# Patient Record
Sex: Male | Born: 1970 | Race: Black or African American | Hispanic: No | Marital: Single | Smoking: Current every day smoker
Health system: Southern US, Community
[De-identification: ages and names within clinical notes are randomized; demographics above are authoritative.]

## PROBLEM LIST (undated history)

## (undated) DIAGNOSIS — K219 Gastro-esophageal reflux disease without esophagitis: Secondary | ICD-10-CM

---

## 2012-03-14 HISTORY — PX: FRACTURE SURGERY: SHX138

## 2014-02-01 ENCOUNTER — Emergency Department (HOSPITAL_COMMUNITY)
Admission: EM | Admit: 2014-02-01 | Discharge: 2014-02-01 | Disposition: A | Discharge status: Home / Self Care | Payer: Self-pay | Attending: Emergency Medicine | Admitting: Emergency Medicine

## 2014-02-01 ENCOUNTER — Emergency Department (HOSPITAL_COMMUNITY): Payer: Self-pay

## 2014-02-01 ENCOUNTER — Encounter (HOSPITAL_COMMUNITY): Payer: Self-pay | Admitting: Emergency Medicine

## 2014-02-01 DIAGNOSIS — Y998 Other external cause status: Secondary | ICD-10-CM | POA: Insufficient documentation

## 2014-02-01 DIAGNOSIS — S82402A Unspecified fracture of shaft of left fibula, initial encounter for closed fracture: Secondary | ICD-10-CM

## 2014-02-01 DIAGNOSIS — Z720 Tobacco use: Secondary | ICD-10-CM | POA: Insufficient documentation

## 2014-02-01 DIAGNOSIS — S82432A Displaced oblique fracture of shaft of left fibula, initial encounter for closed fracture: Secondary | ICD-10-CM | POA: Insufficient documentation

## 2014-02-01 DIAGNOSIS — Y9289 Other specified places as the place of occurrence of the external cause: Secondary | ICD-10-CM | POA: Insufficient documentation

## 2014-02-01 DIAGNOSIS — X58XXXA Exposure to other specified factors, initial encounter: Secondary | ICD-10-CM | POA: Insufficient documentation

## 2014-02-01 DIAGNOSIS — Y9389 Activity, other specified: Secondary | ICD-10-CM | POA: Insufficient documentation

## 2014-02-01 DIAGNOSIS — W1843XA Slipping, tripping and stumbling without falling due to stepping from one level to another, initial encounter: Secondary | ICD-10-CM

## 2014-02-01 MED ORDER — TRAMADOL HCL 50 MG PO TABS
50.0000 mg | ORAL_TABLET | Freq: Once | ORAL | Status: AC
  Administered 2014-02-01: 50 mg via ORAL
  Filled 2014-02-01: qty 1

## 2014-02-01 MED ORDER — HYDROCODONE-ACETAMINOPHEN 5-325 MG PO TABS
1.0000 | ORAL_TABLET | ORAL | Status: DC | PRN
Start: 2014-02-01 — End: 2018-03-08

## 2014-02-01 MED ORDER — IBUPROFEN 600 MG PO TABS: 600.0000 mg | ORAL_TABLET | Freq: Four times a day (QID) | ORAL | Status: DC | PRN

## 2014-02-01 NOTE — ED Provider Notes (Signed)
CSN: [id number]     Arrival date & time 02/01/14  1624 History  This chart was scribed for non-physician practitioner working with No att. providers found by Elveria Risingimelie Horne, ED Scribe. This patient was seen in room TR10C/TR10C and the patient's care was started at 5:11 PM.    Chief Complaint  Patient presents with  . Ankle Pain    The history is provided by the patient. No language interpreter was used.   HPI Comments: Brandon Grant is a 43 y.o. male who presents to the Emergency Department with a left ankle pain onset today at 11am. Patient reports stepping down from height of an elevated van onto the curb and only making contact with his toes; forceful dorsiflexion. Patient reports a compound facture one year ago and having pins placed in his foot which was aggravated by his incident today.  Pain is constant, aching, throbbing, 8/10 at worst. No pain medication taken PTA.  History reviewed. No pertinent past medical history. History reviewed. No pertinent past surgical history. History reviewed. No pertinent family history. History  Substance Use Topics  . Smoking status: Current Every Day Smoker  . Smokeless tobacco: Not on file  . Alcohol Use: No    Review of Systems  Constitutional: Negative for fever and chills.  Musculoskeletal: Positive for arthralgias.  Neurological: Negative for weakness and numbness.      Allergies  Review of patient's allergies indicates no known allergies.  Home Medications   Prior to Admission medications   Medication Sig Start Date End Date Taking? Authorizing Provider  HYDROcodone-acetaminophen (NORCO/VICODIN) 5-325 MG per tablet Take 1-2 tablets by mouth every 4 (four) hours as needed for moderate pain or severe pain. 02/01/14   Junius FinnerErin O'Malley, PA-C  ibuprofen (ADVIL,MOTRIN) 600 MG tablet Take 1 tablet (600 mg total) by mouth every 6 (six) hours as needed. 02/01/14   Junius FinnerErin O'Malley, PA-C   Triage Vitals: BP 108/66 mmHg  Pulse 82  Temp(Src)  98.4 F (36.9 C)  Resp 16  Ht 6' (1.829 m)  Wt 204 lb (92.534 kg)  BMI 27.66 kg/m2  SpO2 98% Physical Exam  Constitutional: He is oriented to person, place, and time. He appears well-developed and well-nourished. No distress.  HENT:  Head: Normocephalic and atraumatic.  Eyes: EOM are normal.  Neck: Neck supple. No tracheal deviation present.  Cardiovascular: Normal rate.   Pulmonary/Chest: Effort normal. No respiratory distress.  Musculoskeletal: Normal range of motion.  Left ankle with well healed surgical scars. No edema. Tenderness to medial aspect of left ankle. No calf tenderness. Full ROM. Pedal pulses 2+.   Neurological: He is alert and oriented to person, place, and time.  Gross sensation intact.  Skin: Skin is warm and dry.  Psychiatric: He has a normal mood and affect. His behavior is normal.  Nursing note and vitals reviewed.   ED Course  Procedures (including critical care time)  COORDINATION OF CARE: 11:43 PM- Discussed treatment plan with patient at bedside and patient agreed to plan.   Labs Review Labs Reviewed - No data to display  Imaging Review Dg Ankle Complete Left  02/01/2014   CLINICAL DATA:  Injury scratch head and ankle 1 year ago. Re-injury today getting out of vehicle when foot slipped.  EXAM: LEFT ANKLE COMPLETE - 3+ VIEW  COMPARISON:  None.  FINDINGS: There is an oblique fracture through the distal left fibula, minimally displaced. Well corticated bone fragments along the medial malleolus likely reflect old injury. No acute tibial abnormality seen.  IMPRESSION: Minimally displaced oblique distal left fibular metaphyseal fracture.   Electronically Signed   By: Charlett NoseKevin  Dover M.D.   On: 02/01/2014 18:41     EKG Interpretation None      MDM   Final diagnoses:  Left fibular fracture, closed, initial encounter  Slip/trip w/o fall d/t step from one level to another, init    Pt presenting to ED with c/o left ankle pain after trip on a curb.  Left  foot neurovascularly in tact. Skin in tact. Sensation in tact. Plain films: minimally displaced oblique distal left fibular metaphyseal fracture. Placed pt in posterior short leg splint, crutches and work note provided. Rx: ibuprofen and norco. Advised to f/u with Dr. Eulah PontMurphy, orthopedics, for recheck and continued management of left fibula fracture. Pt verbalized understanding and agreement with tx plan.  I personally performed the services described in this documentation, which was scribed in my presence. The recorded information has been reviewed and is accurate.    Junius Finnerrin O'Malley, PA-C 02/01/14 2346  Elwin MochaBlair Walden, MD 02/01/14 [phone]

## 2014-02-01 NOTE — ED Notes (Signed)
Pt sts left ankle pain after stepping off curb today; pt sts hx of injury to same leg

## 2014-02-01 NOTE — ED Notes (Signed)
Ortho tech called for treatment.

## 2014-02-01 NOTE — Progress Notes (Signed)
Orthopedic Tech Progress Note Patient Details:  Brandon MapleMichael Muratalla 01-18-1971 [id number] Applied fiberglass short leg splint to LLE.  Pulses, sensation, motion intact before and after application. Capillary refill less than 2 seconds before and after application.  Fit crutches and taught pt. use of same. Ortho Devices Type of Ortho Device: Post (short leg) splint, Crutches Ortho Device/Splint Location: LLE Ortho Device/Splint Interventions: Application   Lesle ChrisGilliland, Keyston Ardolino L 02/01/2014, 8:16 PM

## 2014-02-01 NOTE — Discharge Instructions (Signed)
Cast or Splint Care °Casts and splints support injured limbs and keep bones from moving while they heal.  °HOME CARE °· Keep the cast or splint uncovered during the drying period. °¨ A plaster cast can take 24 to 48 hours to dry. °¨ A fiberglass cast will dry in less than 1 hour. °· Do not rest the cast on anything harder than a pillow for 24 hours. °· Do not put weight on your injured limb. Do not put pressure on the cast. Wait for your doctor's approval. °· Keep the cast or splint dry. °¨ Cover the cast or splint with a plastic bag during baths or wet weather. °¨ If you have a cast over your chest and belly (trunk), take sponge baths until the cast is taken off. °¨ If your cast gets wet, dry it with a towel or blow dryer. Use the cool setting on the blow dryer. °· Keep your cast or splint clean. Wash a dirty cast with a damp cloth. °· Do not put any objects under your cast or splint. °· Do not scratch the skin under the cast with an object. If itching is a problem, use a blow dryer on a cool setting over the itchy area. °· Do not trim or cut your cast. °· Do not take out the padding from inside your cast. °· Exercise your joints near the cast as told by your doctor. °· Raise (elevate) your injured limb on 1 or 2 pillows for the first 1 to 3 days. °GET HELP IF: °· Your cast or splint cracks. °· Your cast or splint is too tight or too loose. °· You itch badly under the cast. °· Your cast gets wet or has a soft spot. °· You have a bad smell coming from the cast. °· You get an object stuck under the cast. °· Your skin around the cast becomes red or sore. °· You have new or more pain after the cast is put on. °GET HELP RIGHT AWAY IF: °· You have fluid leaking through the cast. °· You cannot move your fingers or toes. °· Your fingers or toes turn blue or white or are cool, painful, or puffy (swollen). °· You have tingling or lose feeling (numbness) around the injured area. °· You have bad pain or pressure under the  cast. °· You have trouble breathing or have shortness of breath. °· You have chest pain. °Document Released: 06/30/2010 Document Revised: 10/31/2012 Document Reviewed: 09/06/2012 °Exit[employer]® Patient Information ©2015 [employer]. This information is not intended to replace advice given to you by your health care provider. Make sure you discuss any questions you have with your health care provider. ° °

## 2015-12-16 IMAGING — CR DG ANKLE COMPLETE 3+V*L*
3 series · 3 of 3 positions shown · non-contrast
Comparison: None.

CLINICAL DATA: Injury scratch head and ankle 1 year ago. Re-injury
today getting out of vehicle when foot slipped.

EXAM:
LEFT ANKLE COMPLETE - 3+ VIEW

[x ankle ap left]
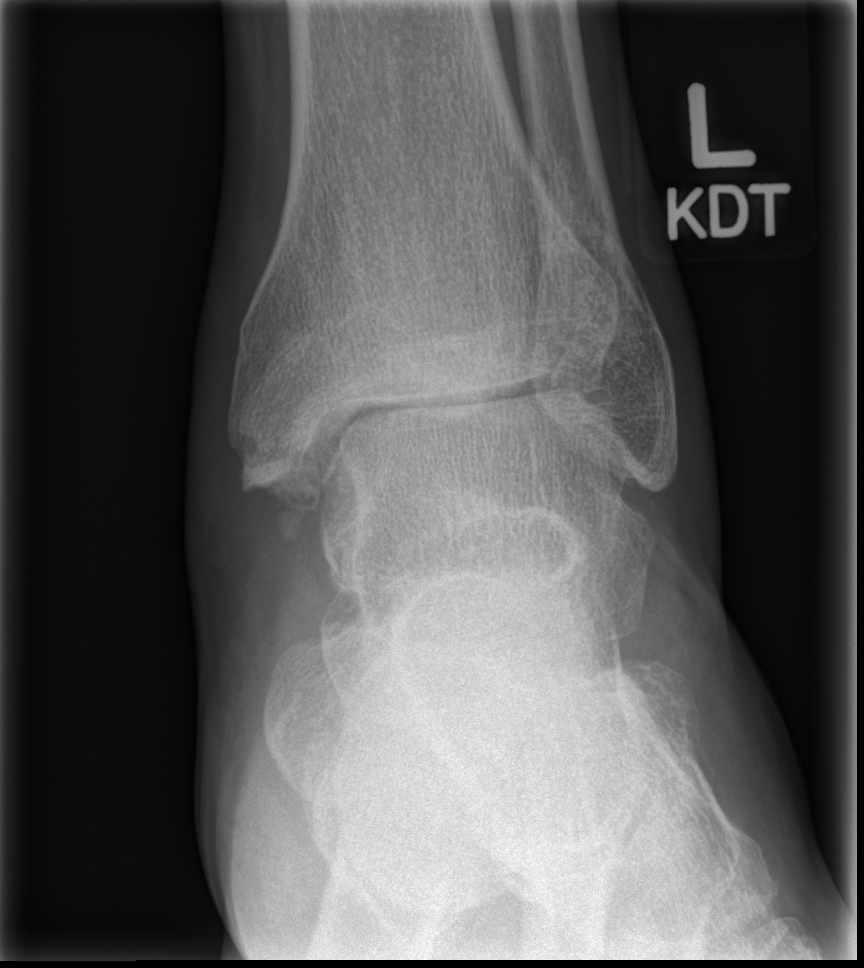

[x ankle obl left]
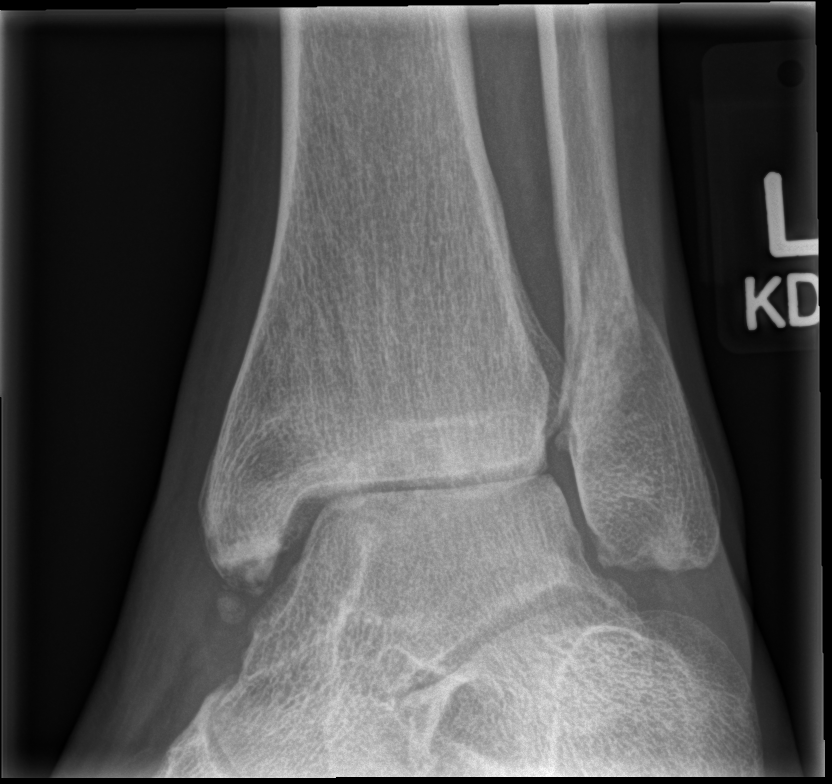

[x ankle lat left]
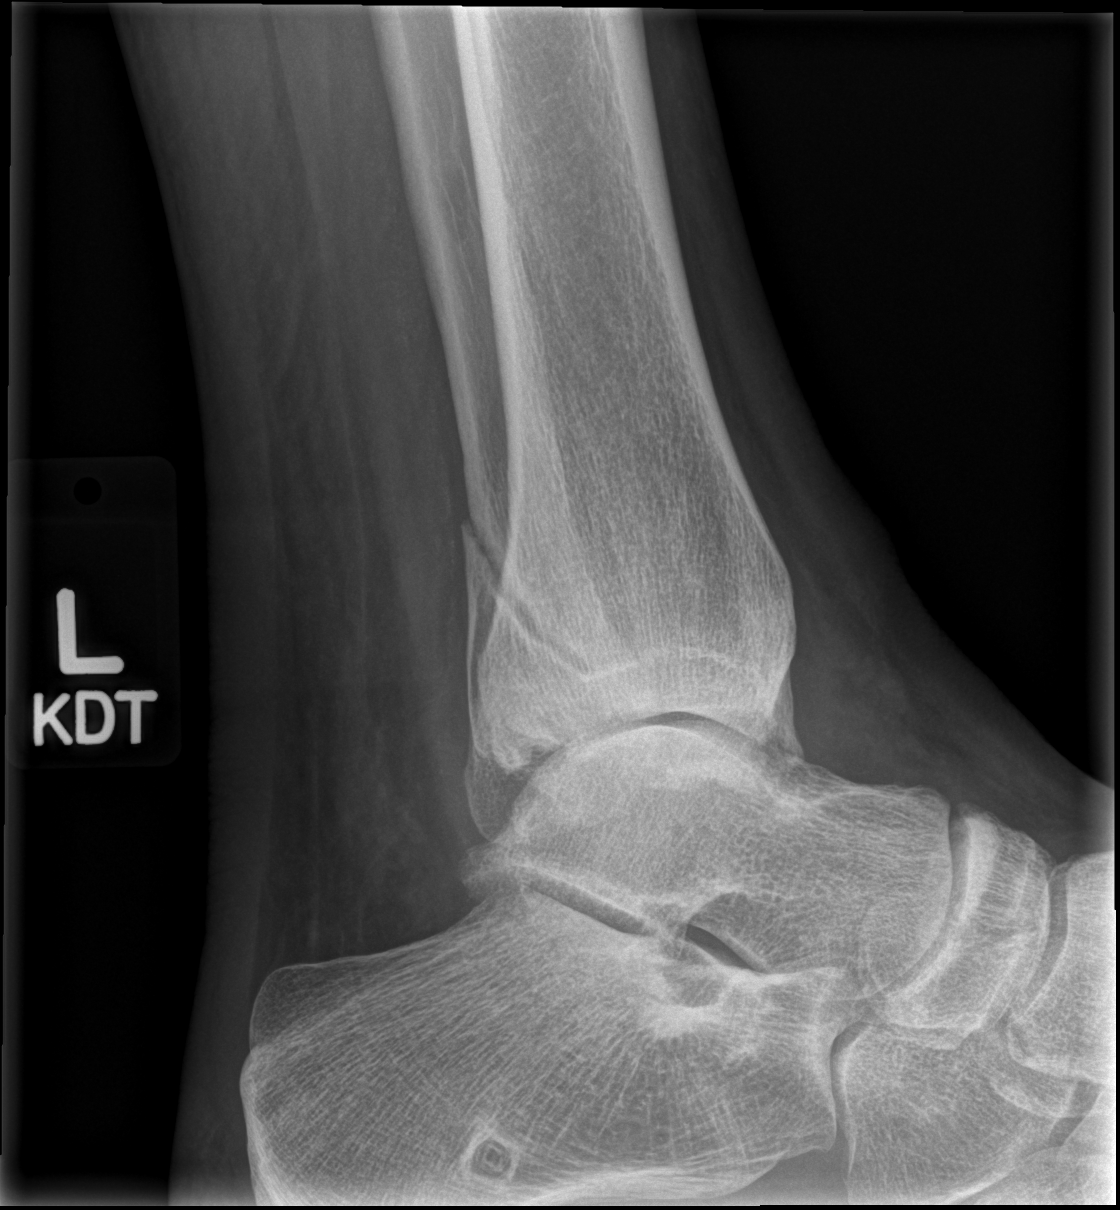

[3 of 3 positions shown; findings below may reference images not displayed]

FINDINGS: There is an oblique fracture through the distal left fibula,
minimally displaced. Well corticated bone fragments along the medial
malleolus likely reflect old injury. No acute tibial abnormality
seen.
IMPRESSION: Minimally displaced oblique distal left fibular metaphyseal
fracture.

## 2018-03-08 ENCOUNTER — Encounter (HOSPITAL_COMMUNITY): Payer: Self-pay | Admitting: Emergency Medicine

## 2018-03-08 ENCOUNTER — Emergency Department (HOSPITAL_COMMUNITY)
Admission: EM | Admit: 2018-03-08 | Discharge: 2018-03-08 | Disposition: A | Discharge status: Home / Self Care | Payer: Self-pay | Attending: Emergency Medicine | Admitting: Emergency Medicine

## 2018-03-08 ENCOUNTER — Other Ambulatory Visit: Payer: Self-pay

## 2018-03-08 ENCOUNTER — Emergency Department (HOSPITAL_COMMUNITY): Payer: Self-pay

## 2018-03-08 DIAGNOSIS — F17200 Nicotine dependence, unspecified, uncomplicated: Secondary | ICD-10-CM | POA: Insufficient documentation

## 2018-03-08 DIAGNOSIS — M19172 Post-traumatic osteoarthritis, left ankle and foot: Secondary | ICD-10-CM | POA: Insufficient documentation

## 2018-03-08 DIAGNOSIS — M25572 Pain in left ankle and joints of left foot: Secondary | ICD-10-CM | POA: Insufficient documentation

## 2018-03-08 MED ORDER — IBUPROFEN 600 MG PO TABS: 600.0000 mg | ORAL_TABLET | Freq: Four times a day (QID) | ORAL | 0 refills | Status: DC | PRN

## 2018-03-08 MED ORDER — ACETAMINOPHEN 500 MG PO TABS: 500.0000 mg | ORAL_TABLET | Freq: Four times a day (QID) | ORAL | 0 refills | Status: DC | PRN

## 2018-03-08 NOTE — ED Notes (Signed)
ED Provider at bedside. 

## 2018-03-08 NOTE — Discharge Instructions (Signed)
Alternate ibuprofen and Tylenol as prescribed for your pain.  Use ice 3-4 times daily alternating 20 minutes on, 20 minutes off, as needed for swelling.  Wear your brace especially when walking.  Please follow-up with the orthopedic doctor for further evaluation and treatment of your ongoing pain.

## 2018-03-08 NOTE — ED Triage Notes (Signed)
Per pt: here with c/o left foot and ankle pain from a previous accident and surgery.  Pt states pain began this morning 7/10.

## 2018-03-08 NOTE — ED Provider Notes (Addendum)
[facility] Provider Note   CSN: [id number] Arrival date & time: 03/08/18  1126     History   Chief Complaint Chief Complaint  Patient presents with  . Foot Pain  . Ankle Pain    HPI Brandon Grant is a 47 y.o. male with history of ankle surgery from previous trauma who presents with left ankle pain that began this morning.  He rated his pain as 7 out of 10.  He has had some tingling to the pain.  He reports he is in a rehab facility here [place]  He feels like maybe the cold is causing him to have arthritis pain.  He denies any new injury.  He has been taking ibuprofen without relief.  Patient denies any fevers or history of IV drug use.  He denies any chest pain, shortness of breath, abdominal pain, nausea, vomiting.  HPI  History reviewed. No pertinent past medical history.  There are no active problems to display for this patient.   History reviewed. No pertinent surgical history.      Home Medications    Prior to Admission medications   Medication Sig Start Date End Date Taking? Authorizing Provider  acetaminophen (TYLENOL) 500 MG tablet Take 1 tablet (500 mg total) by mouth every 6 (six) hours as needed. 03/08/18   Brantley Wiley, Waylan BogaAlexandra M, PA-C  ibuprofen (ADVIL,MOTRIN) 600 MG tablet Take 1 tablet (600 mg total) by mouth every 6 (six) hours as needed. 03/08/18   Emi HolesLaw, Elisandra Deshmukh M, PA-C    Family History History reviewed. No pertinent family history.  Social History Social History   Tobacco Use  . Smoking status: Current Every Day Smoker  Substance Use Topics  . Alcohol use: No  . Drug use: No     Allergies   Patient has no known allergies.   Review of Systems Review of Systems  Constitutional: Negative for chills and fever.  HENT: Negative for facial swelling and sore throat.   Respiratory: Negative for shortness of breath.   Cardiovascular: Negative for chest pain.  Gastrointestinal:  Negative for abdominal pain, nausea and vomiting.  Genitourinary: Negative for dysuria.  Musculoskeletal: Positive for arthralgias. Negative for back pain and joint swelling.  Skin: Negative for rash and wound.  Neurological: Positive for numbness (paresthesia). Negative for headaches.  Psychiatric/Behavioral: The patient is not nervous/anxious.      Physical Exam Updated Vital Signs BP 118/69 (BP Location: Right Arm)   Pulse 74   Temp 98.2 F (36.8 C) (Oral)   Resp 16   Ht 6' (1.829 m)   Wt 95.3 kg   SpO2 100%   BMI 28.48 kg/m   Physical Exam Vitals signs and nursing note reviewed.  Constitutional:      General: He is not in acute distress.    Appearance: He is well-developed. He is not diaphoretic.  HENT:     Head: Normocephalic and atraumatic.     Mouth/Throat:     Pharynx: No oropharyngeal exudate.  Eyes:     General: No scleral icterus.       Right eye: No discharge.        Left eye: No discharge.     Conjunctiva/sclera: Conjunctivae normal.     Pupils: Pupils are equal, round, and reactive to light.  Neck:     Musculoskeletal: Normal range of motion and neck supple.     Thyroid: No thyromegaly.  Cardiovascular:     Rate and Rhythm:  Normal rate and regular rhythm.     Heart sounds: Normal heart sounds. No murmur. No friction rub. No gallop.   Pulmonary:     Effort: Pulmonary effort is normal. No respiratory distress.     Breath sounds: Normal breath sounds. No stridor. No wheezing or rales.  Abdominal:     General: Bowel sounds are normal. There is no distension.     Palpations: Abdomen is soft.     Tenderness: There is no abdominal tenderness. There is no guarding or rebound.  Musculoskeletal:     Comments: Postsurgical scarring noted to the left medial ankle with tenderness in the area, no tenderness to the foot, knee, proximal fibula; range of motion is limited, however this is baseline; sensation intact, DP pulses intact; there is no warmth or erythema    Lymphadenopathy:     Cervical: No cervical adenopathy.  Skin:    General: Skin is warm and dry.     Coloration: Skin is not pale.     Findings: No rash.  Neurological:     Mental Status: He is alert.     Coordination: Coordination normal.      ED Treatments / Results  Labs (all labs ordered are listed, but only abnormal results are displayed) Labs Reviewed - No data to display  EKG None  Radiology Dg Ankle Complete Left  Result Date: 03/08/2018 CLINICAL DATA:  Medial left ankle pain since a twisting injury last night. Initial encounter. EXAM: LEFT ANKLE COMPLETE - 3+ VIEW COMPARISON:  Plain films left ankle 02/01/2014. FINDINGS: The patient has remote healed fractures of the medial and lateral malleoli. No acute fracture is identified. There is advanced tibiotalar degenerative change with bone-on-bone joint space narrowing, subchondral sclerosis and some subchondral cyst formation. Mild osteophytosis is seen about the joint. No joint effusion. IMPRESSION: No acute abnormality. Remote healed medial and lateral malleolar fractures. Advanced tibiotalar osteoarthritis. Electronically Signed   By: Drusilla Kannerhomas  Dalessio M.D.   On: 03/08/2018 12:33    Procedures Procedures (including critical care time)  Medications Ordered in ED Medications - No data to display   Initial Impression / Assessment and Plan / ED Course  I have reviewed the triage vital signs and the nursing notes.  Pertinent labs & imaging results that were available during my care of the patient were reviewed by me and considered in my medical decision making (see chart for details).     Patient with left ankle pain.  Patient has history of fracture and surgery 4 years ago.  X-ray today shows no acute abnormality, but remote healed medial and lateral malleoli fractures as well as advanced tibiotalar osteoarthritis.  There are no signs of septic joint.  Patient will be treated supportively with NSAIDs, Tylenol, ASO,  crutches.  Patient given referral to orthopedics.  Patient is ambulatory.  Return precautions discussed.  Patient understands and agrees with plan.  Patient vital stable throughout ED course and discharged in satisfactory condition.  Final Clinical Impressions(s) / ED Diagnoses   Final diagnoses:  Acute left ankle pain  Post-traumatic osteoarthritis of left ankle    ED Discharge Orders         Ordered    ibuprofen (ADVIL,MOTRIN) 600 MG tablet  Every 6 hours PRN     03/08/18 1336    acetaminophen (TYLENOL) 500 MG tablet  Every 6 hours PRN     03/08/18 1336              Keston Seever, [place] M, [place]PA-C 03/08/18  [address] DO 03/08/18 1526

## 2020-01-20 IMAGING — DX DG ANKLE COMPLETE 3+V*L*
3 series · 3 of 3 positions shown · non-contrast
Comparison: Plain films left ankle 02/01/2014.

CLINICAL DATA: Medial left ankle pain since a twisting injury last
night. Initial encounter.

EXAM:
LEFT ANKLE COMPLETE - 3+ VIEW

[x ankle lat left]
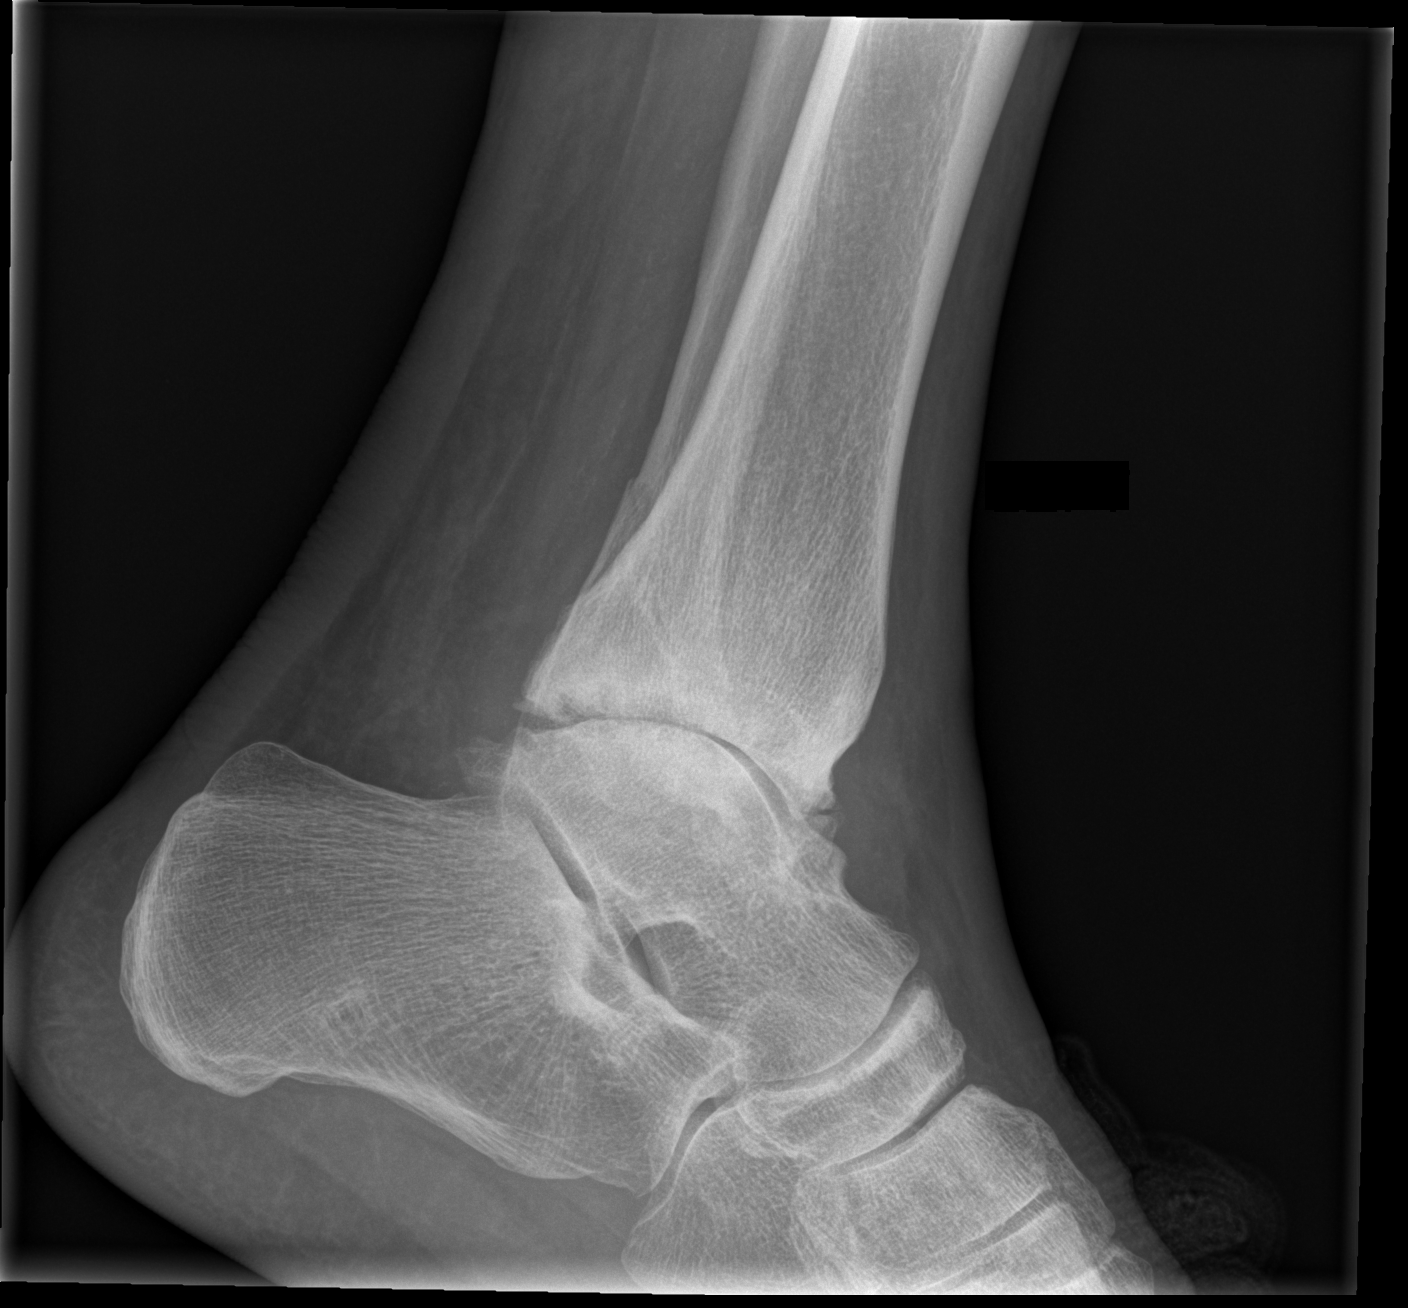

[x ankle ap left]
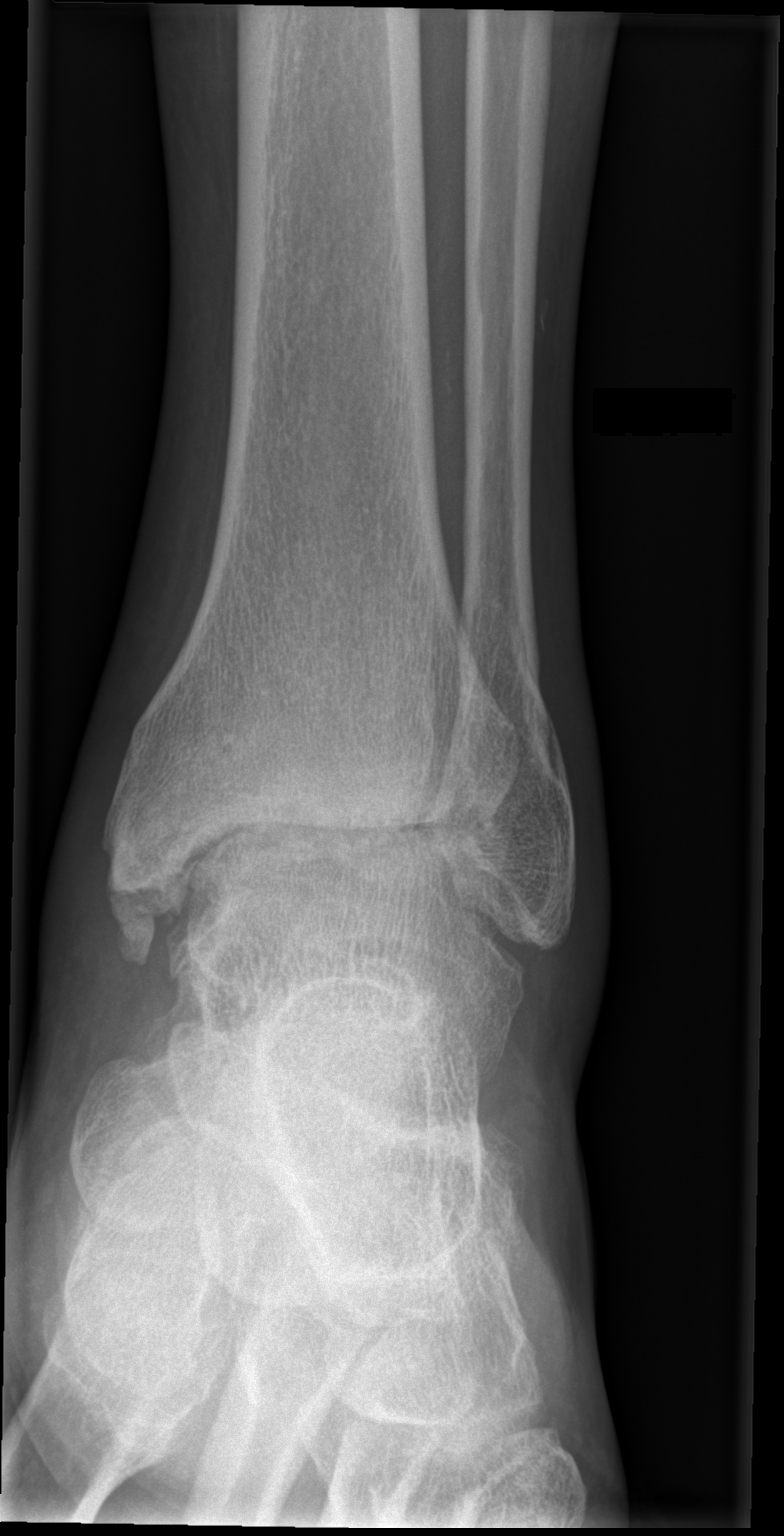

[x ankle obl left]
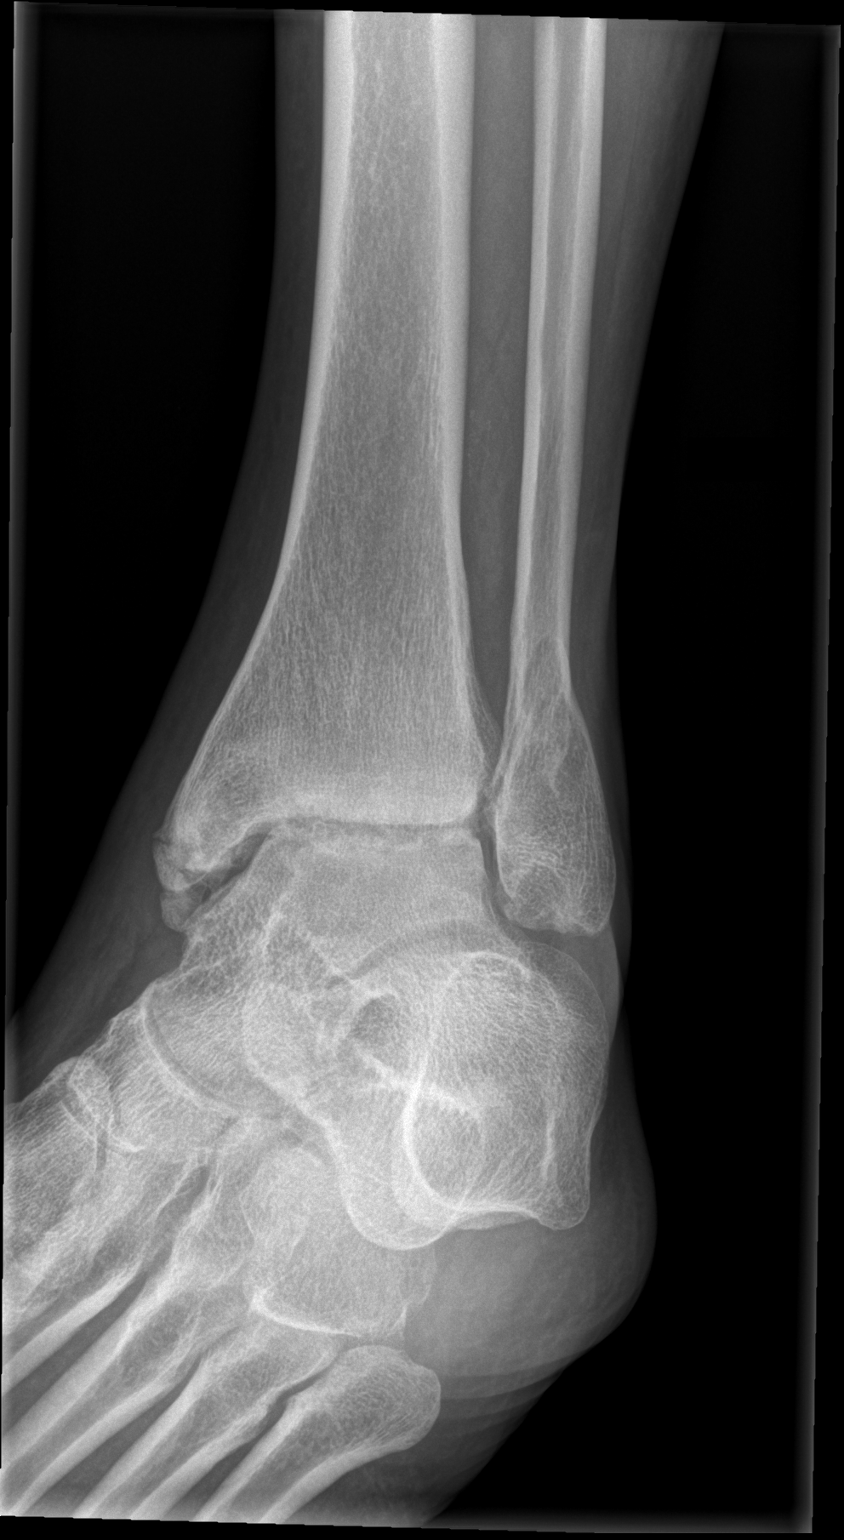

[3 of 3 positions shown; findings below may reference images not displayed]

FINDINGS: The patient has remote healed fractures of the medial and lateral
malleoli. No acute fracture is identified. There is advanced
tibiotalar degenerative change with bone-on-bone joint space
narrowing, subchondral sclerosis and some subchondral cyst
formation. Mild osteophytosis is seen about the joint. No joint
effusion.
IMPRESSION: No acute abnormality.

Remote healed medial and lateral malleolar fractures.

Advanced tibiotalar osteoarthritis.

## 2021-09-07 ENCOUNTER — Encounter (HOSPITAL_COMMUNITY): Payer: Self-pay

## 2021-09-07 ENCOUNTER — Ambulatory Visit (HOSPITAL_COMMUNITY)
Admission: EM | Admit: 2021-09-07 | Discharge: 2021-09-07 | Disposition: A | Discharge status: Home / Self Care | Payer: Self-pay | Attending: Emergency Medicine | Admitting: Emergency Medicine

## 2021-09-07 ENCOUNTER — Ambulatory Visit (INDEPENDENT_AMBULATORY_CARE_PROVIDER_SITE_OTHER): Payer: Self-pay

## 2021-09-07 DIAGNOSIS — M25572 Pain in left ankle and joints of left foot: Secondary | ICD-10-CM

## 2021-09-07 DIAGNOSIS — M545 Low back pain, unspecified: Secondary | ICD-10-CM

## 2021-09-07 DIAGNOSIS — G8929 Other chronic pain: Secondary | ICD-10-CM

## 2021-09-07 LAB — POCT URINALYSIS DIPSTICK, ED / UC
Bilirubin Urine: NEGATIVE
Glucose, UA: NEGATIVE mg/dL
Hgb urine dipstick: NEGATIVE
Ketones, ur: NEGATIVE mg/dL
Leukocytes,Ua: NEGATIVE
Nitrite: NEGATIVE
Protein, ur: NEGATIVE mg/dL
Specific Gravity, Urine: 1.02 (ref 1.005–1.030)
Urobilinogen, UA: 0.2 mg/dL (ref 0.0–1.0)
pH: 8.5 — ABNORMAL HIGH (ref 5.0–8.0)

## 2021-09-07 MED ORDER — IBUPROFEN 800 MG PO TABS
800.0000 mg | ORAL_TABLET | Freq: Once | ORAL | Status: AC
  Administered 2021-09-07: 800 mg via ORAL

## 2021-09-07 MED ORDER — IBUPROFEN 800 MG PO TABS
ORAL_TABLET | ORAL | Status: AC
  Filled 2021-09-07: qty 1

## 2021-09-07 MED ORDER — CYCLOBENZAPRINE HCL 10 MG PO TABS: 10.0000 mg | ORAL_TABLET | Freq: Two times a day (BID) | ORAL | 0 refills | Status: DC | PRN

## 2021-09-07 NOTE — ED Triage Notes (Signed)
Pt states lower back pain and frequent urination for the past week.  Also C/O left ankle pain.  Pt denies any injury, and has not been taking anything at home for the pain.

## 2021-09-30 ENCOUNTER — Ambulatory Visit (INDEPENDENT_AMBULATORY_CARE_PROVIDER_SITE_OTHER): Payer: Self-pay

## 2021-09-30 ENCOUNTER — Encounter (HOSPITAL_COMMUNITY): Payer: Self-pay

## 2021-09-30 ENCOUNTER — Ambulatory Visit (HOSPITAL_COMMUNITY)
Admission: EM | Admit: 2021-09-30 | Discharge: 2021-09-30 | Disposition: A | Discharge status: Home / Self Care | Payer: Self-pay | Attending: Emergency Medicine | Admitting: Emergency Medicine

## 2021-09-30 DIAGNOSIS — M25572 Pain in left ankle and joints of left foot: Secondary | ICD-10-CM

## 2021-09-30 DIAGNOSIS — S99912A Unspecified injury of left ankle, initial encounter: Secondary | ICD-10-CM

## 2021-09-30 DIAGNOSIS — G8929 Other chronic pain: Secondary | ICD-10-CM

## 2021-09-30 DIAGNOSIS — M79672 Pain in left foot: Secondary | ICD-10-CM

## 2021-09-30 MED ORDER — IBUPROFEN 800 MG PO TABS
ORAL_TABLET | ORAL | Status: AC
  Filled 2021-09-30: qty 1

## 2021-09-30 MED ORDER — IBUPROFEN 800 MG PO TABS
800.0000 mg | ORAL_TABLET | Freq: Once | ORAL | Status: AC
  Administered 2021-09-30: 800 mg via ORAL

## 2021-09-30 NOTE — Discharge Instructions (Addendum)
Can take ibuprofen every 6 hours for pain.  I recommend resting, elevating the leg, applying ice to the ankle as needed.  Please follow-up with orthopedics.  They specialize in bone and joint injuries and can further evaluate you. Have the [facility] house call to set up an appointment for you.

## 2021-09-30 NOTE — ED Provider Notes (Signed)
[facility]    CSN: [id number] Arrival date & time: 09/30/21  1739      History   Chief Complaint Chief Complaint  Patient presents with   Foot Injury    HPI Brandon Grant is a 51 y.o. male.  Presents with left ankle pain and swelling. Reports he stepped off a curb today wearing slippers and hurt his ankle.  Does not believe he twisted it. Did not fall. History of foot surgery and hardware in this ankle. Feels like there is "something hard" in the wrong place.  He was not able to walk after this injury occurred.  Has not taken any medicine prior to arrival.  Reports swelling has somewhat gone down but pain remains.  8/10  He does have chronic pain of this ankle since medial and lateral malleolar fractures occurred many years ago.  Additionally osteoarthritis and sclerosis on imaging done by this provider at previous visit 3 weeks ago. No hardware was visualized on that imaging.  History reviewed. No pertinent past medical history.  There are no problems to display for this patient.  History reviewed. No pertinent surgical history.    Home Medications    Prior to Admission medications   Medication Sig Start Date End Date Taking? Authorizing Provider  ibuprofen (ADVIL,MOTRIN) 600 MG tablet Take 1 tablet (600 mg total) by mouth every 6 (six) hours as needed. 03/08/18  Yes Law, Waylan Boga, PA-C  acetaminophen (TYLENOL) 500 MG tablet Take 1 tablet (500 mg total) by mouth every 6 (six) hours as needed. 03/08/18   Law, Waylan Boga, PA-C  cyclobenzaprine (FLEXERIL) 10 MG tablet Take 1 tablet (10 mg total) by mouth 2 (two) times daily as needed for muscle spasms. 09/07/21   Graysin Luczynski, Ray Church    Family History History reviewed. No pertinent family history.  Social History Social History   Tobacco Use   Smoking status: Every Day  Substance Use Topics   Alcohol use: No   Drug use: No     Allergies   Patient has no known allergies.   Review of  Systems Review of Systems Per HPI  Physical Exam Triage Vital Signs ED Triage Vitals  Enc Vitals Group     BP 09/30/21 1756 118/77     Pulse Rate 09/30/21 1756 89     Resp 09/30/21 1756 16     Temp 09/30/21 1756 98.9 F (37.2 C)     Temp Source 09/30/21 1756 Oral     SpO2 09/30/21 1756 97 %     Weight 09/30/21 1754 190 lb 12.8 oz (86.5 kg)     Height 09/30/21 1754 5\' 11"  (1.803 m)     Head Circumference --      Peak Flow --      Pain Score 09/30/21 1753 8     Pain Loc --      Pain Edu? --      Excl. in GC? --    No data found.  Updated Vital Signs BP 118/77 (BP Location: Left Arm)   Pulse 89   Temp 98.9 F (37.2 C) (Oral)   Resp 16   Ht 5\' 11"  (1.803 m)   Wt 190 lb 12.8 oz (86.5 kg)   SpO2 97%   BMI 26.61 kg/m     Physical Exam Vitals and nursing note reviewed.  Constitutional:      General: He is not in acute distress.    Appearance: Normal appearance.     Comments: Sitting in  wheelchair  HENT:     Mouth/Throat:     Pharynx: Oropharynx is clear.  Eyes:     Conjunctiva/sclera: Conjunctivae normal.  Cardiovascular:     Rate and Rhythm: Normal rate and regular rhythm.     Pulses: Normal pulses.     Heart sounds: Normal heart sounds.  Pulmonary:     Effort: Pulmonary effort is normal.     Breath sounds: Normal breath sounds.  Musculoskeletal:     Comments: I do not note any swelling to the ankle or foot. He has limited ROM due to the pain. Sensation intact distally  Skin:    General: Skin is warm and dry.     Findings: No bruising, ecchymosis or laceration.  Neurological:     Mental Status: He is alert.     UC Treatments / Results  Labs (all labs ordered are listed, but only abnormal results are displayed) Labs Reviewed - No data to display  EKG  Radiology DG Ankle 2 Views Left  Result Date: 09/30/2021 CLINICAL DATA:  Injury EXAM: LEFT ANKLE - 2 VIEW COMPARISON:  09/07/2021, 03/08/2018 FINDINGS: No acute displaced fracture or malalignment.  Chronic medial and lateral malleolar fractures. Advanced arthritis at the tibiotalar joint with heterogeneous sclerosis and talar dome collapse. IMPRESSION: 1. Chronic fracture deformities of the medial and lateral malleoli. No definite acute osseous abnormality 2. Advanced arthritis of the tibiotalar joint with collapse of the talar dome. Electronically Signed   By: Jasmine Pang M.D.   On: 09/30/2021 18:38   DG Foot Complete Left  Result Date: 09/30/2021 CLINICAL DATA:  Foot injury EXAM: LEFT FOOT - COMPLETE 3+ VIEW COMPARISON:  None FINDINGS: No acute displaced fracture or malalignment. The joint spaces are patent. Soft tissues are unremarkable IMPRESSION: No acute osseous abnormality Electronically Signed   By: Jasmine Pang M.D.   On: 09/30/2021 18:37    Procedures Procedures  Medications Ordered in UC Medications  ibuprofen (ADVIL) tablet 800 mg (800 mg Oral Given 09/30/21 1844)    Initial Impression / Assessment and Plan / UC Course  I have reviewed the triage vital signs and the nursing notes.  Pertinent labs & imaging results that were available during my care of the patient were reviewed by me and considered in my medical decision making (see chart for details).  Ibuprofen dose for pain. Left ankle and foot xray both negative for acute change. No hardware on imaging today, none noted in images dating back to 2015. Discussed with patient images look good today and his pain should improve with RICE therapy. Recommend follow up with orthopedics. I provided this information at last visit and recommend [facility] house call to make an appointment for him. Ibuprofen every 6 hours, ice and elevate for swelling. Emergency department for any worsening symptoms. Patient agrees to plan and is discharged in stable condition.  Final Clinical Impressions(s) / UC Diagnoses   Final diagnoses:  Injury of left ankle, initial encounter  Chronic pain of left ankle     Discharge Instructions       Can take ibuprofen every 6 hours for pain.  I recommend resting, elevating the leg, applying ice to the ankle as needed.  Please follow-up with orthopedics.  They specialize in bone and joint injuries and can further evaluate you. Have the [facility] house call to set up an appointment for you.    ED Prescriptions   None    PDMP not reviewed this encounter.   Kathrine Haddock 09/30/21 1849

## 2021-09-30 NOTE — ED Triage Notes (Signed)
Patient states he previously fractured the left foot. States he thinks he re-fractured the foot. States he had foot surgery a long time ago where they put a screw in his foot. He thinks he damaged this as he can feel something hard in the wrong place.   Has had the pain for 4 days, states he re-fractured the foot by stepping down off a curb wrong.

## 2021-10-13 ENCOUNTER — Encounter: Payer: Self-pay | Admitting: Nurse Practitioner

## 2021-10-13 ENCOUNTER — Ambulatory Visit (INDEPENDENT_AMBULATORY_CARE_PROVIDER_SITE_OTHER): Payer: Self-pay | Admitting: Nurse Practitioner

## 2021-10-13 VITALS — BP 126/87 | HR 67 | Temp 97.5°F | Ht 70.0 in | Wt 189.4 lb

## 2021-10-13 DIAGNOSIS — M25572 Pain in left ankle and joints of left foot: Secondary | ICD-10-CM

## 2021-10-13 DIAGNOSIS — G8929 Other chronic pain: Secondary | ICD-10-CM

## 2021-10-13 DIAGNOSIS — Z0000 Encounter for general adult medical examination without abnormal findings: Secondary | ICD-10-CM

## 2021-10-13 NOTE — Patient Instructions (Addendum)
1. Chronic pain of left ankle  - Ankle brace - Ambulatory referral to Orthopedic Surgery   Follow up:  Follow up in 3 months for physical

## 2021-10-13 NOTE — Progress Notes (Unsigned)
@Patient  ID: Brandon Grant, male    DOB: 07/09/1970, 51 y.o.   MRN: [id number]  Chief Complaint  Patient presents with   Establish Care    Pt is here to establish care. Pt states left ankle hurts, numbness , feels like needles is in his foot.pt has been feeling like this for 8 year's. Patient doesn't believe he twisted it pain level is a 6-7    Referring provider: No ref. provider found  HPI  Patient presents today to establish care.  Patient currently resides at the [facility] for rehab.  Patient denies any significant medical history states that he is not on any prescription medications.  Patient does complain today of left ankle pain.  Patient states that he injured the ankle in 2014 and did have surgery at that time.  His ankle has started to hurt again and he is having a hard time bearing weight on it.  Patient was recently seen in the ED on 09/30/2021 and did have x-rays completed.  He was referred to Ortho and does have an appointment scheduled for this upcoming Friday. Denies f/c/s, n/v/d, hemoptysis, PND, leg swelling Denies chest pain or edema      No Known Allergies   There is no immunization history on file for this patient.  History reviewed. No pertinent past medical history.  Tobacco History: Social History   Tobacco Use  Smoking Status Every Day  Smokeless Tobacco Not on file   Ready to quit: Not Answered Counseling given: Not Answered   Outpatient Encounter Medications as of 10/13/2021  Medication Sig   acetaminophen (TYLENOL) 500 MG tablet Take 1 tablet (500 mg total) by mouth every 6 (six) hours as needed. (Patient not taking: Reported on 10/13/2021)   cyclobenzaprine (FLEXERIL) 10 MG tablet Take 1 tablet (10 mg total) by mouth 2 (two) times daily as needed for muscle spasms. (Patient not taking: Reported on 10/13/2021)   ibuprofen (ADVIL,MOTRIN) 600 MG tablet Take 1 tablet (600 mg total) by mouth every 6 (six) hours as needed. (Patient not taking: Reported on  10/13/2021)   No facility-administered encounter medications on file as of 10/13/2021.     Review of Systems  Review of Systems  Constitutional: Negative.   HENT: Negative.    Cardiovascular: Negative.   Gastrointestinal: Negative.   Musculoskeletal:        Left ankle pain  Allergic/Immunologic: Negative.   Neurological: Negative.   Psychiatric/Behavioral: Negative.         Physical Exam  BP 126/87 (BP Location: Left Arm, Patient Position: Sitting, Cuff Size: Large)   Pulse 67   Temp (!) 97.5 F (36.4 C)   Ht 5\' 10"  (1.778 m)   Wt 189 lb 6.4 oz (85.9 kg)   SpO2 100%   BMI 27.18 kg/m   Wt Readings from Last 5 Encounters:  10/13/21 189 lb 6.4 oz (85.9 kg)  09/30/21 190 lb 12.8 oz (86.5 kg)  03/08/18 210 lb (95.3 kg)  02/01/14 204 lb (92.5 kg)     Physical Exam Vitals and nursing note reviewed.  Constitutional:      General: He is not in acute distress.    Appearance: He is well-developed.  Cardiovascular:     Rate and Rhythm: Normal rate and regular rhythm.  Pulmonary:     Effort: Pulmonary effort is normal.     Breath sounds: Normal breath sounds.  Musculoskeletal:     Left ankle: No swelling. Tenderness present. Decreased range of motion.  Skin:  General: Skin is warm and dry.  Neurological:     Mental Status: He is alert and oriented to person, place, and time.      Lab Results:  CBC    Component Value Date/Time   WBC 6.8 10/13/2021 1518   RBC 4.40 10/13/2021 1518   HGB 14.0 10/13/2021 1518   HCT 39.4 10/13/2021 1518   PLT 235 10/13/2021 1518   MCV 90 10/13/2021 1518   MCH 31.8 10/13/2021 1518   MCHC 35.5 10/13/2021 1518   RDW 12.6 10/13/2021 1518    BMET    Component Value Date/Time   NA 140 10/13/2021 1518   K 4.3 10/13/2021 1518   CL 99 10/13/2021 1518   CO2 24 10/13/2021 1518   GLUCOSE 90 10/13/2021 1518   BUN 14 10/13/2021 1518   CREATININE 1.37 (H) 10/13/2021 1518   CALCIUM 9.4 10/13/2021 1518    BNP No results found  for: "BNP"  ProBNP No results found for: "PROBNP"  Imaging: DG Ankle 2 Views Left  Result Date: 09/30/2021 CLINICAL DATA:  Injury EXAM: LEFT ANKLE - 2 VIEW COMPARISON:  09/07/2021, 03/08/2018 FINDINGS: No acute displaced fracture or malalignment. Chronic medial and lateral malleolar fractures. Advanced arthritis at the tibiotalar joint with heterogeneous sclerosis and talar dome collapse. IMPRESSION: 1. Chronic fracture deformities of the medial and lateral malleoli. No definite acute osseous abnormality 2. Advanced arthritis of the tibiotalar joint with collapse of the talar dome. Electronically Signed   By: Jasmine Pang M.D.   On: 09/30/2021 18:38   DG Foot Complete Left  Result Date: 09/30/2021 CLINICAL DATA:  Foot injury EXAM: LEFT FOOT - COMPLETE 3+ VIEW COMPARISON:  None FINDINGS: No acute displaced fracture or malalignment. The joint spaces are patent. Soft tissues are unremarkable IMPRESSION: No acute osseous abnormality Electronically Signed   By: Jasmine Pang M.D.   On: 09/30/2021 18:37     Assessment & Plan:   Chronic pain of left ankle - Ankle brace - Ambulatory referral to Orthopedic Surgery   Follow up:  Follow up in 3 months for physical     Ivonne Andrew, NP 10/14/2021

## 2021-10-14 DIAGNOSIS — G8929 Other chronic pain: Secondary | ICD-10-CM | POA: Insufficient documentation

## 2021-10-14 LAB — COMPREHENSIVE METABOLIC PANEL
ALT: 17 IU/L (ref 0–44)
AST: 14 IU/L (ref 0–40)
Albumin/Globulin Ratio: 1.7 (ref 1.2–2.2)
Albumin: 4.4 g/dL (ref 4.1–5.1)
Alkaline Phosphatase: 91 IU/L (ref 44–121)
BUN/Creatinine Ratio: 10 (ref 9–20)
BUN: 14 mg/dL (ref 6–24)
Bilirubin Total: 0.3 mg/dL (ref 0.0–1.2)
CO2: 24 mmol/L (ref 20–29)
Calcium: 9.4 mg/dL (ref 8.7–10.2)
Chloride: 99 mmol/L (ref 96–106)
Creatinine, Ser: 1.37 mg/dL — ABNORMAL HIGH (ref 0.76–1.27)
Globulin, Total: 2.6 g/dL (ref 1.5–4.5)
Glucose: 90 mg/dL (ref 70–99)
Potassium: 4.3 mmol/L (ref 3.5–5.2)
Sodium: 140 mmol/L (ref 134–144)
Total Protein: 7 g/dL (ref 6.0–8.5)
eGFR: 64 mL/min/{1.73_m2} (ref >59)

## 2021-10-14 LAB — CBC
Hematocrit: 39.4 % (ref 37.5–51.0)
Hemoglobin: 14 g/dL (ref 13.0–17.7)
MCH: 31.8 pg (ref 26.6–33.0)
MCHC: 35.5 g/dL (ref 31.5–35.7)
MCV: 90 fL (ref 79–97)
Platelets: 235 10*3/uL (ref 150–450)
RBC: 4.4 x10E6/uL (ref 4.14–5.80)
RDW: 12.6 % (ref 11.6–15.4)
WBC: 6.8 10*3/uL (ref 3.4–10.8)

## 2021-10-14 NOTE — Assessment & Plan Note (Signed)
-   Ankle brace - Ambulatory referral to Orthopedic Surgery   Follow up:  Follow up in 3 months for physical

## 2021-10-15 ENCOUNTER — Encounter: Payer: Self-pay | Admitting: Orthopaedic Surgery

## 2021-10-15 ENCOUNTER — Ambulatory Visit (INDEPENDENT_AMBULATORY_CARE_PROVIDER_SITE_OTHER): Payer: Self-pay | Admitting: Orthopaedic Surgery

## 2021-10-15 DIAGNOSIS — M19172 Post-traumatic osteoarthritis, left ankle and foot: Secondary | ICD-10-CM

## 2021-10-15 MED ORDER — METHYLPREDNISOLONE ACETATE 40 MG/ML IJ SUSP
13.3300 mg | INTRAMUSCULAR | Status: AC | PRN
  Administered 2021-10-15: 13.33 mg via INTRA_ARTICULAR

## 2021-10-15 MED ORDER — BUPIVACAINE HCL 0.25 % IJ SOLN
0.6600 mL | INTRAMUSCULAR | Status: AC | PRN
  Administered 2021-10-15: .66 mL via INTRA_ARTICULAR

## 2021-10-15 MED ORDER — LIDOCAINE HCL 1 % IJ SOLN
1.0000 mL | INTRAMUSCULAR | Status: AC | PRN
  Administered 2021-10-15: 1 mL

## 2021-10-15 NOTE — Progress Notes (Signed)
   Office Visit Note   Patient: Brandon Grant           Date of Birth: [birth year]           MRN: [id number] Visit Date: 10/15/2021              Requested by: Ivonne Andrew, NP [phone] [address], [address] [place],  [place] [address] PCP: Ivonne Andrew, NP   Assessment & Plan: Visit Diagnoses:  1. Post-traumatic osteoarthritis, left ankle and foot     Plan: Impression is left ankle advanced tibiotalar osteoarthritis.  Today, we discussed proceeding with a cortisone injection in addition to an ASO to help with stability.  We have also discussed referral to Dr. Lajoyce Corners if the injection fails to relieve his symptoms.  He will follow-up as needed.  Follow-Up Instructions: Return if symptoms worsen or fail to improve.   Orders:  Orders Placed This Encounter  Procedures   Medium Joint Inj: L ankle   No orders of the defined types were placed in this encounter.     Procedures: Medium Joint Inj: L ankle on 10/15/2021 9:03 AM Indications: pain Medications: 1 mL lidocaine 1 %; 0.66 mL bupivacaine 0.25 %; 13.33 mg methylPREDNISolone acetate 40 MG/ML      Clinical Data: No additional findings.   Subjective: Chief Complaint  Patient presents with   Left Ankle - Pain    HPI patient is a pleasant 51 year old gentleman who comes in today with left ankle pain.  This is been ongoing since 2014 after fracturing his ankle and undergoing what sounds like external fixation.  He states that this is the only surgery he ever had.  The pain he has is throughout the entire ankle and is worse when he is going from seated to standing position as well as the first few steps of walking.  He also has pain when moving his ankle.  He takes over-the-counter pain medication occasionally without relief.  No previous cortisone injection to the ankle.  Review of Systems as detailed in HPI.  All others reviewed and are negative.   Objective: Vital Signs: There were no vitals taken for this visit.  Physical  Exam well-developed and well-nourished gentleman in no acute distress.  Alert and oriented x3.  Ortho Exam the ankle exam reveals no swelling.  Moderate diffuse tenderness throughout.  Very limited range of motion secondary to pain and stiffness.  He is neurovascular intact distally.  Specialty Comments:  No specialty comments available.  Imaging: No new imaging   PMFS History: Patient Active Problem List   Diagnosis Date Noted   Chronic pain of left ankle 10/14/2021   History reviewed. No pertinent past medical history.  Family History  Problem Relation Age of Onset   Cancer Mother    Cancer Father     History reviewed. No pertinent surgical history. Social History   Occupational History   Not on file  Tobacco Use   Smoking status: Every Day   Smokeless tobacco: Not on file  Vaping Use   Vaping Use: Never used  Substance and Sexual Activity   Alcohol use: No   Drug use: No   Sexual activity: Yes

## 2021-11-26 ENCOUNTER — Emergency Department (HOSPITAL_COMMUNITY): Payer: Self-pay

## 2021-11-26 ENCOUNTER — Encounter (HOSPITAL_COMMUNITY): Payer: Self-pay

## 2021-11-26 ENCOUNTER — Other Ambulatory Visit: Payer: Self-pay

## 2021-11-26 ENCOUNTER — Emergency Department (HOSPITAL_COMMUNITY)
Admission: EM | Admit: 2021-11-26 | Discharge: 2021-11-26 | Disposition: A | Discharge status: Home / Self Care | Payer: Self-pay | Attending: Emergency Medicine | Admitting: Emergency Medicine

## 2021-11-26 DIAGNOSIS — X509XXA Other and unspecified overexertion or strenuous movements or postures, initial encounter: Secondary | ICD-10-CM | POA: Insufficient documentation

## 2021-11-26 DIAGNOSIS — S93401A Sprain of unspecified ligament of right ankle, initial encounter: Secondary | ICD-10-CM | POA: Insufficient documentation

## 2021-11-26 MED ORDER — OXYCODONE-ACETAMINOPHEN 5-325 MG PO TABS
2.0000 | ORAL_TABLET | Freq: Once | ORAL | Status: AC
  Administered 2021-11-26: 2 via ORAL
  Filled 2021-11-26: qty 2

## 2021-11-26 NOTE — ED Triage Notes (Addendum)
Pt c/o increased L sided ankle pain with prior hx of surgery to the ankle and compound fracture. Ankle brace in place prior to arrival. Pt reports that he possible has rolled it but is unsure. Got cortisol shot on 10/15/2021, pt reports no relief from shot. Pt is alert and oriented, speaking in full sentences, no signs of apparent distress.

## 2021-11-26 NOTE — ED Notes (Signed)
Called ortho tech for CAM boot.

## 2021-11-26 NOTE — ED Notes (Signed)
Patient received orthopedic boot from ortho tech, before being discharge.

## 2021-11-26 NOTE — Discharge Instructions (Addendum)
Your x-ray today was negative for new fractures or injury.  Continue wearing your brace when walking.  Take Tylenol or Motrin for pain.  If you continue to have severe pain follow-up with Dr. Lajoyce Corners.

## 2021-11-26 NOTE — ED Provider Notes (Signed)
[facility] EMERGENCY DEPARTMENT Provider Note   CSN: [id number] Arrival date & time: 11/26/21  3500     History  Chief Complaint  Patient presents with   Ankle Pain    Brandon Grant is a 51 y.o. male with recent history of compound fracture to the ankle in early August presents to the emergency department for evaluation of pain to the right ankle after rolling it yesterday.  Patient has been following with Dr. Lajoyce Corners and had a cortisone injection done last month that he believes did not help very much.  Yesterday, he was walking and read twisted it.  He is still walking although he does endorse some pain and discomfort.  He denies numbness and tingling.  No other injuries or complaints.  He is wearing the brace provided by orthopedics here today.   Ankle Pain Associated symptoms: no fever        Home Medications Prior to Admission medications   Medication Sig Start Date End Date Taking? Authorizing Provider  acetaminophen (TYLENOL) 500 MG tablet Take 1 tablet (500 mg total) by mouth every 6 (six) hours as needed. Patient not taking: Reported on 10/13/2021 03/08/18   Emi Holes, PA-C  cyclobenzaprine (FLEXERIL) 10 MG tablet Take 1 tablet (10 mg total) by mouth 2 (two) times daily as needed for muscle spasms. Patient not taking: Reported on 10/13/2021 09/07/21   Rising, Lurena Joiner, PA-C  ibuprofen (ADVIL,MOTRIN) 600 MG tablet Take 1 tablet (600 mg total) by mouth every 6 (six) hours as needed. Patient not taking: Reported on 10/13/2021 03/08/18   Emi Holes, PA-C      Allergies    Patient has no known allergies.    Review of Systems   Review of Systems  Constitutional:  Negative for chills and fever.  Respiratory:  Negative for shortness of breath.   Musculoskeletal:  Positive for arthralgias and joint swelling.    Physical Exam Updated Vital Signs BP 105/68   Pulse 73   Temp 97.7 F (36.5 C) (Oral)   Resp (!) 22   Ht 5\' 11"  (1.803 m)   Wt 90.7  kg   SpO2 97%   BMI 27.89 kg/m  Physical Exam Vitals and nursing note reviewed.  Constitutional:      General: He is not in acute distress.    Appearance: He is not ill-appearing.  HENT:     Head: Atraumatic.  Eyes:     Conjunctiva/sclera: Conjunctivae normal.  Cardiovascular:     Rate and Rhythm: Normal rate and regular rhythm.     Pulses: Normal pulses.     Heart sounds: No murmur heard. Pulmonary:     Effort: Pulmonary effort is normal. No respiratory distress.     Breath sounds: Normal breath sounds.  Abdominal:     General: Abdomen is flat. There is no distension.     Palpations: Abdomen is soft.     Tenderness: There is no abdominal tenderness.  Musculoskeletal:        General: Normal range of motion.     Cervical back: Normal range of motion.     Comments: Very mild swelling and tenderness noted to the right lateral malleolus.  2+ distal pulses.  Skin:    General: Skin is warm and dry.     Capillary Refill: Capillary refill takes less than 2 seconds.  Neurological:     General: No focal deficit present.     Mental Status: He is alert.  Psychiatric:  Mood and Affect: Mood normal.     ED Results / Procedures / Treatments   Labs (all labs ordered are listed, but only abnormal results are displayed) Labs Reviewed - No data to display  EKG None  Radiology DG Ankle 2 Views Left  Result Date: 11/26/2021 CLINICAL DATA:  History of left ankle injury. EXAM: LEFT ANKLE - 2 VIEW COMPARISON:  09/30/2021 as well as other prior studies in 2023, 2019 and 2015. FINDINGS: Again noted are chronic fractures of both medial and lateral malleoli with nonunion the medial malleolus fracture. Associated severe degenerative disease of the ankle mortise with severe sclerosis of the talar dome and distal tibia. There has been progressive collapse of the talar dome since 2015. No acute fracture or soft tissue abnormalities. 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 Associated progressive severe degenerative disease of the ankle mortise with progressive collapse of the talar dome since 2015. Electronically Signed   By: Irish Lack M.D.   On: 11/26/2021 09:40    Procedures Procedures    Medications Ordered in ED Medications  oxyCODONE-acetaminophen (PERCOCET/ROXICET) 5-325 MG per tablet 2 tablet (2 tablets Oral Given 11/26/21 1300)    ED Course/ Medical Decision Making/ A&P                           Medical Decision Making Amount and/or Complexity of Data Reviewed Radiology: ordered.  Risk Prescription drug management.   51 year old male presents the emergency department for evaluation of right ankle pain and swelling that started after rolling the ankle yesterday.  History of recent compound fracture in early August.  Vitals are without significant abnormality.  X-ray of the right ankle ordered in triage identifies chronic compound fractures without any acute findings.  Patient's pain managed here in the emergency department with 2 Percocet.  Advised that I cannot send him home with additional pain medication and he will need to follow with Dr. Lajoyce Corners if symptoms persist.  Advised to continue wearing brace ambulation.  Patient expresses understanding and is amenable to plan.  Discharged home in stable condition. Final Clinical Impression(s) / ED Diagnoses Final diagnoses:  Sprain of right ankle, unspecified ligament, initial encounter    Rx / DC Orders ED Discharge Orders     None         Delight Ovens 11/26/21 1311    Bethann Berkshire, MD 11/28/21 [phone]

## 2021-12-13 ENCOUNTER — Ambulatory Visit (INDEPENDENT_AMBULATORY_CARE_PROVIDER_SITE_OTHER): Payer: Self-pay | Admitting: Nurse Practitioner

## 2021-12-13 ENCOUNTER — Encounter: Payer: Self-pay | Admitting: Nurse Practitioner

## 2021-12-13 VITALS — BP 139/93 | HR 72 | Wt 206.0 lb

## 2021-12-13 DIAGNOSIS — G8929 Other chronic pain: Secondary | ICD-10-CM

## 2021-12-13 DIAGNOSIS — Z0000 Encounter for general adult medical examination without abnormal findings: Secondary | ICD-10-CM

## 2021-12-13 DIAGNOSIS — M25572 Pain in left ankle and joints of left foot: Secondary | ICD-10-CM

## 2021-12-13 DIAGNOSIS — K219 Gastro-esophageal reflux disease without esophagitis: Secondary | ICD-10-CM

## 2021-12-13 DIAGNOSIS — R350 Frequency of micturition: Secondary | ICD-10-CM

## 2021-12-13 LAB — POCT URINALYSIS DIP (CLINITEK)
Bilirubin, UA: NEGATIVE
Glucose, UA: NEGATIVE mg/dL
Ketones, POC UA: NEGATIVE mg/dL
Leukocytes, UA: NEGATIVE
Nitrite, UA: NEGATIVE
POC PROTEIN,UA: NEGATIVE
Spec Grav, UA: 1.015 (ref 1.010–1.025)
Urobilinogen, UA: 0.2 E.U./dL
pH, UA: 7 (ref 5.0–8.0)

## 2021-12-13 MED ORDER — OMEPRAZOLE 20 MG PO CPDR: 20.0000 mg | DELAYED_RELEASE_CAPSULE | Freq: Every day | ORAL | 3 refills | Status: DC

## 2021-12-13 MED ORDER — MELOXICAM 15 MG PO TABS: 15.0000 mg | ORAL_TABLET | Freq: Every day | ORAL | 0 refills | Status: DC

## 2021-12-13 NOTE — Progress Notes (Unsigned)
@Patient  ID: Brandon Grant, male    DOB: 1970/11/21, 51 y.o.   MRN: [id number]  Chief Complaint  Patient presents with   Foot Pain    Pt is here for LT foot pain pt had cortisone injection and he still has pain pt was told he will have to have surgery if injection didn't help the pain.    Referring provider: [id number], NP   HPI  Patient presents for a follow up on ankle pain. He has already seen ortho for this issue. He is requesting a referral back. Will trial mobic. Denies f/c/s, n/v/d, hemoptysis, PND, leg swelling Denies chest pain or edema       No Known Allergies   There is no immunization history on file for this patient.  History reviewed. No pertinent past medical history.  Tobacco History: Social History   Tobacco Use  Smoking Status Every Day  Smokeless Tobacco Not on file   Ready to quit: Not Answered Counseling given: Not Answered   Outpatient Encounter Medications as of 12/13/2021  Medication Sig   meloxicam (MOBIC) 15 MG tablet Take 1 tablet (15 mg total) by mouth daily.   omeprazole (PRILOSEC) 20 MG capsule Take 1 capsule (20 mg total) by mouth daily.   acetaminophen (TYLENOL) 500 MG tablet Take 1 tablet (500 mg total) by mouth every 6 (six) hours as needed. (Patient not taking: Reported on 12/13/2021)   cyclobenzaprine (FLEXERIL) 10 MG tablet Take 1 tablet (10 mg total) by mouth 2 (two) times daily as needed for muscle spasms. (Patient not taking: Reported on 12/13/2021)   ibuprofen (ADVIL,MOTRIN) 600 MG tablet Take 1 tablet (600 mg total) by mouth every 6 (six) hours as needed. (Patient not taking: Reported on 12/13/2021)   No facility-administered encounter medications on file as of 12/13/2021.     Review of Systems  Review of Systems  Constitutional: Negative.   HENT: Negative.    Cardiovascular: Negative.   Gastrointestinal: Negative.   Musculoskeletal:        Left ankle pain  Allergic/Immunologic: Negative.   Neurological: Negative.    Psychiatric/Behavioral: Negative.         Physical Exam  BP (!) 139/93 (BP Location: Left Arm, Patient Position: Sitting, Cuff Size: Large)   Pulse 72   Wt 206 lb 0.4 oz (93.5 kg)   SpO2 100%   BMI 28.73 kg/m   Wt Readings from Last 5 Encounters:  12/13/21 206 lb 0.4 oz (93.5 kg)  11/26/21 200 lb (90.7 kg)  10/13/21 189 lb 6.4 oz (85.9 kg)  09/30/21 190 lb 12.8 oz (86.5 kg)  03/08/18 210 lb (95.3 kg)     Physical Exam Vitals and nursing note reviewed.  Constitutional:      General: He is not in acute distress.    Appearance: He is well-developed.  Cardiovascular:     Rate and Rhythm: Normal rate and regular rhythm.  Pulmonary:     Effort: Pulmonary effort is normal.     Breath sounds: Normal breath sounds.  Skin:    General: Skin is warm and dry.  Neurological:     Mental Status: He is alert and oriented to person, place, and time.      Lab Results:  CBC    Component Value Date/Time   WBC 7.5 12/13/2021 1504   RBC 4.74 12/13/2021 1504   HGB 14.8 12/13/2021 1504   HCT 42.2 12/13/2021 1504   PLT 218 12/13/2021 1504   MCV 89 12/13/2021 1504  MCH 31.2 12/13/2021 1504   MCHC 35.1 12/13/2021 1504   RDW 12.6 12/13/2021 1504    BMET    Component Value Date/Time   NA 140 10/13/2021 1518   K 4.3 10/13/2021 1518   CL 99 10/13/2021 1518   CO2 24 10/13/2021 1518   GLUCOSE 90 10/13/2021 1518   BUN 14 10/13/2021 1518   CREATININE 1.37 (H) 10/13/2021 1518   CALCIUM 9.4 10/13/2021 1518    BNP No results found for: "BNP"  ProBNP No results found for: "PROBNP"  Imaging: DG Ankle 2 Views Left  Result Date: 11/26/2021 CLINICAL DATA:  History of left ankle injury. EXAM: LEFT ANKLE - 2 VIEW COMPARISON:  09/30/2021 as well as other prior studies in 2023, 2019 and 2015. FINDINGS: Again noted are chronic fractures of both medial and lateral malleoli with nonunion the medial malleolus fracture. Associated severe degenerative disease of the ankle mortise with  severe sclerosis of the talar dome and distal tibia. There has been progressive collapse of the talar dome since 2015. No acute fracture or soft tissue abnormalities. 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 Associated progressive severe degenerative disease of the ankle mortise with progressive collapse of the talar dome since 2015. Electronically Signed   By: Aletta Edouard M.D.   On: 11/26/2021 09:40     Assessment & Plan:   Chronic pain of left ankle - CBC - Comprehensive metabolic panel; Future - Lipid Panel - TSH  2. Chronic pain of left ankle  - meloxicam (MOBIC) 15 MG tablet; Take 1 tablet (15 mg total) by mouth daily.  Dispense: 30 tablet; Refill: 0 - Ambulatory referral to Orthopedic Surgery  Follow up:  Follow up in 6 months or sooner if needed     Fenton Foy, NP 12/15/2021

## 2021-12-13 NOTE — Patient Instructions (Signed)
1. Routine adult health maintenance  - CBC - Comprehensive metabolic panel; Future - Lipid Panel - TSH  2. Chronic pain of left ankle  - meloxicam (MOBIC) 15 MG tablet; Take 1 tablet (15 mg total) by mouth daily.  Dispense: 30 tablet; Refill: 0 - Ambulatory referral to Orthopedic Surgery  Follow up:  Follow up in 6 months or sooner if needed

## 2021-12-14 LAB — CBC
Hematocrit: 42.2 % (ref 37.5–51.0)
Hemoglobin: 14.8 g/dL (ref 13.0–17.7)
MCH: 31.2 pg (ref 26.6–33.0)
MCHC: 35.1 g/dL (ref 31.5–35.7)
MCV: 89 fL (ref 79–97)
Platelets: 218 10*3/uL (ref 150–450)
RBC: 4.74 x10E6/uL (ref 4.14–5.80)
RDW: 12.6 % (ref 11.6–15.4)
WBC: 7.5 10*3/uL (ref 3.4–10.8)

## 2021-12-14 LAB — LIPID PANEL
Chol/HDL Ratio: 5.6 ratio — ABNORMAL HIGH (ref 0.0–5.0)
Cholesterol, Total: 209 mg/dL — ABNORMAL HIGH (ref 100–199)
HDL: 37 mg/dL — ABNORMAL LOW (ref >39)
LDL Chol Calc (NIH): 130 mg/dL — ABNORMAL HIGH (ref 0–99)
Triglycerides: 236 mg/dL — ABNORMAL HIGH (ref 0–149)
VLDL Cholesterol Cal: 42 mg/dL — ABNORMAL HIGH (ref 5–40)

## 2021-12-14 LAB — TSH: TSH: 1.38 u[IU]/mL (ref 0.450–4.500)

## 2021-12-15 NOTE — Assessment & Plan Note (Signed)
-   CBC - Comprehensive metabolic panel; Future - Lipid Panel - TSH  2. Chronic pain of left ankle  - meloxicam (MOBIC) 15 MG tablet; Take 1 tablet (15 mg total) by mouth daily.  Dispense: 30 tablet; Refill: 0 - Ambulatory referral to Orthopedic Surgery  Follow up:  Follow up in 6 months or sooner if needed

## 2022-01-04 ENCOUNTER — Other Ambulatory Visit: Payer: Self-pay | Admitting: Nurse Practitioner

## 2022-01-04 DIAGNOSIS — G8929 Other chronic pain: Secondary | ICD-10-CM

## 2022-01-06 ENCOUNTER — Telehealth: Payer: Self-pay

## 2022-01-06 NOTE — Telephone Encounter (Signed)
Meloxicam

## 2022-01-07 ENCOUNTER — Other Ambulatory Visit: Payer: Self-pay | Admitting: Nurse Practitioner

## 2022-01-07 DIAGNOSIS — G8929 Other chronic pain: Secondary | ICD-10-CM

## 2022-01-07 MED ORDER — MELOXICAM 15 MG PO TABS: 15.0000 mg | ORAL_TABLET | Freq: Every day | ORAL | 0 refills | Status: DC

## 2022-01-10 ENCOUNTER — Telehealth: Payer: Self-pay

## 2022-01-10 ENCOUNTER — Other Ambulatory Visit: Payer: Self-pay

## 2022-01-10 DIAGNOSIS — G8929 Other chronic pain: Secondary | ICD-10-CM

## 2022-01-10 MED ORDER — MELOXICAM 15 MG PO TABS: 15.0000 mg | ORAL_TABLET | Freq: Every day | ORAL | 0 refills | Status: DC

## 2022-01-10 NOTE — Telephone Encounter (Signed)
Melaxicam to the correct pharmacy which is [facility]  Can we get this done today?

## 2022-01-12 ENCOUNTER — Other Ambulatory Visit: Payer: Self-pay | Admitting: Nurse Practitioner

## 2022-01-12 DIAGNOSIS — G8929 Other chronic pain: Secondary | ICD-10-CM

## 2022-01-13 ENCOUNTER — Ambulatory Visit: Payer: Self-pay | Admitting: Nurse Practitioner

## 2022-01-21 ENCOUNTER — Ambulatory Visit (INDEPENDENT_AMBULATORY_CARE_PROVIDER_SITE_OTHER): Payer: Self-pay | Admitting: Nurse Practitioner

## 2022-01-21 ENCOUNTER — Encounter: Payer: Self-pay | Admitting: Nurse Practitioner

## 2022-01-21 VITALS — BP 126/63 | HR 66 | Temp 97.6°F | Ht 72.0 in | Wt 217.2 lb

## 2022-01-21 DIAGNOSIS — G8929 Other chronic pain: Secondary | ICD-10-CM

## 2022-01-21 DIAGNOSIS — M25572 Pain in left ankle and joints of left foot: Secondary | ICD-10-CM

## 2022-01-21 DIAGNOSIS — Z716 Tobacco abuse counseling: Secondary | ICD-10-CM | POA: Insufficient documentation

## 2022-01-21 MED ORDER — CYCLOBENZAPRINE HCL 5 MG PO TABS: 5.0000 mg | ORAL_TABLET | Freq: Three times a day (TID) | ORAL | 0 refills | Status: DC | PRN

## 2022-01-21 MED ORDER — METHYLPREDNISOLONE SODIUM SUCC 40 MG IJ SOLR
40.0000 mg | Freq: Once | INTRAMUSCULAR | Status: AC
  Administered 2022-01-21: 40 mg via INTRAMUSCULAR

## 2022-01-21 MED ORDER — KETOROLAC TROMETHAMINE 30 MG/ML IJ SOLN
30.0000 mg | Freq: Once | INTRAMUSCULAR | Status: AC
  Administered 2022-01-21: 30 mg via INTRAMUSCULAR

## 2022-01-21 NOTE — Assessment & Plan Note (Addendum)
Chronic condition he was referred to orthopedics during his last office visit but states that he did not receive the calls from orthopedics.  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     Toradol 30 mg one-time injection, Solu-Medrol 40 mg 1 time injection given in the office today  RX flexeril 5 mg 3 times daily as needed Continue meloxicam 15 mg daily. Referral sent to orthopedics patient to that he should be expecting a call from them.

## 2022-01-21 NOTE — Patient Instructions (Addendum)
Nurse please give Toradol 30mg  injection today. Solumedrol 40mg  IM injection today   Continue meloxicam 15mg  daily , start flexeril 5mg  three times daily as needed fro muscle spasm.    [phone]   please call this number to schedule an appointment   It is important that you exercise regularly at least 30 minutes 5 times a week as tolerated  Think about what you will eat, plan ahead. Choose " clean, green, fresh or frozen" over canned, processed or packaged foods which are more sugary, salty and fatty. 70 to 75% of food eaten should be vegetables and fruit. Three meals at set times with snacks allowed between meals, but they must be fruit or vegetables. Aim to eat over a 12 hour period , example 7 am to 7 pm, and STOP after  your last meal of the day. Drink water,generally about 64 ounces per day, no other drink is as healthy. Fruit juice is best enjoyed in a healthy way, by EATING the fruit.  Thanks for choosing [facility] we consider it a privelige to serve you.

## 2022-01-21 NOTE — Progress Notes (Signed)
Established Patient Office Visit  Subjective:  Patient ID: Brandon Grant, male    DOB: February 21, 1971  Age: 51 y.o. MRN: [id number]  CC:  Chief Complaint  Patient presents with   Ankle Pain    Left follow up still hurting     HPI Brandon Grant is a 51 y.o. male with past medical history of chronic pain of left ankle, tobacco abuse who presents for follow-up for his chronic left ankle pain for the past 5 months, patient stated that he had ankle injury in 2015 and subsequently had surgery to repair the injury.  Currently Has sharp pain rated 9/10, denies numbness , tingling.  He has been taking meloxicam 15 mg daily for his pain.  Was referred to orthopedics during his last visit but the patient stated that he did not receive the calls from them.  States that meloxicam helps his pain some  Smokes 2-3 cigratess  daily, patient denies wheezing, chest pain, shortness of breath, need to quit smoking discussed with patient he verbalized understanding.      No past medical history on file.  No past surgical history on file.  Family History  Problem Relation Age of Onset   Cancer Mother    Cancer Father     Social History   Socioeconomic History   Marital status: Single    Spouse name: Not on file   Number of children: Not on file   Years of education: Not on file   Highest education level: Not on file  Occupational History   Not on file  Tobacco Use   Smoking status: Every Day   Smokeless tobacco: Not on file  Vaping Use   Vaping Use: Never used  Substance and Sexual Activity   Alcohol use: No   Drug use: No   Sexual activity: Yes  Other Topics Concern   Not on file  Social History Narrative   Not on file   Social Determinants of Health   Financial Resource Strain: Not on file  Food Insecurity: Not on file  Transportation Needs: Not on file  Physical Activity: Not on file  Stress: Not on file  Social Connections: Not on file  Intimate Partner Violence: Not on file     Outpatient Medications Prior to Visit  Medication Sig Dispense Refill   acetaminophen (TYLENOL) 500 MG tablet Take 1 tablet (500 mg total) by mouth every 6 (six) hours as needed. 30 tablet 0   meloxicam (MOBIC) 15 MG tablet Take 1 tablet (15 mg total) by mouth daily. 30 tablet 0   omeprazole (PRILOSEC) 20 MG capsule Take 1 capsule (20 mg total) by mouth daily. 30 capsule 3   ibuprofen (ADVIL,MOTRIN) 600 MG tablet Take 1 tablet (600 mg total) by mouth every 6 (six) hours as needed. (Patient not taking: Reported on 12/13/2021) 30 tablet 0   cyclobenzaprine (FLEXERIL) 10 MG tablet Take 1 tablet (10 mg total) by mouth 2 (two) times daily as needed for muscle spasms. (Patient not taking: Reported on 12/13/2021) 20 tablet 0   No facility-administered medications prior to visit.    No Known Allergies  ROS Review of Systems  Constitutional: Negative.  Negative for activity change, appetite change, chills and diaphoresis.  Respiratory:  Negative for apnea, cough, chest tightness, shortness of breath, wheezing and stridor.   Cardiovascular: Negative.  Negative for chest pain, palpitations and leg swelling.  Gastrointestinal:  Negative for abdominal distention, abdominal pain and anal bleeding.  Genitourinary: Negative.   Musculoskeletal:  Negative for back pain, gait problem, joint swelling and myalgias.       Left ankle pain  Neurological: Negative.   Psychiatric/Behavioral:  Negative for agitation, behavioral problems, confusion and decreased concentration.       Objective:    Physical Exam Constitutional:      General: He is not in acute distress.    Appearance: He is not ill-appearing, toxic-appearing or diaphoretic.  Eyes:     General:        Right eye: No discharge.        Left eye: No discharge.     Conjunctiva/sclera: Conjunctivae normal.  Cardiovascular:     Rate and Rhythm: Normal rate and regular rhythm.     Pulses: Normal pulses.     Heart sounds: Normal heart sounds.  No murmur heard.    No friction rub. No gallop.  Pulmonary:     Effort: Pulmonary effort is normal. No respiratory distress.     Breath sounds: Normal breath sounds. No stridor. No wheezing or rhonchi.  Chest:     Chest wall: No tenderness.  Abdominal:     General: There is no distension.     Palpations: Abdomen is soft.     Tenderness: There is no abdominal tenderness. There is no guarding.  Musculoskeletal:        General: Tenderness present. No swelling or deformity.     Right lower leg: No edema.     Left lower leg: No edema.     Comments: Tenderness on range of motion of left ankle, skin warm and dry no redness or swelling noted has palpable pedal pulses  Skin:    Capillary Refill: Capillary refill takes less than 2 seconds.     Coloration: Skin is not jaundiced or pale.     Findings: No bruising, erythema, lesion or rash.  Neurological:     Mental Status: He is alert.     Cranial Nerves: No cranial nerve deficit.     Sensory: No sensory deficit.     Motor: No weakness.     Coordination: Coordination normal.     Gait: Gait normal.  Psychiatric:        Mood and Affect: Mood normal.        Behavior: Behavior normal.        Thought Content: Thought content normal.        Judgment: Judgment normal.     BP 126/63   Pulse 66   Temp 97.6 F (36.4 C)   Ht 6' (1.829 m)   Wt 217 lb 3.2 oz (98.5 kg)   SpO2 100%   BMI 29.46 kg/m  Wt Readings from Last 3 Encounters:  01/21/22 217 lb 3.2 oz (98.5 kg)  12/13/21 206 lb 0.4 oz (93.5 kg)  11/26/21 200 lb (90.7 kg)    Lab Results  Component Value Date   TSH 1.380 12/13/2021   Lab Results  Component Value Date   WBC 7.5 12/13/2021   HGB 14.8 12/13/2021   HCT 42.2 12/13/2021   MCV 89 12/13/2021   PLT 218 12/13/2021   Lab Results  Component Value Date   NA 140 10/13/2021   K 4.3 10/13/2021   CO2 24 10/13/2021   GLUCOSE 90 10/13/2021   BUN 14 10/13/2021   CREATININE 1.37 (H) 10/13/2021   BILITOT 0.3 10/13/2021    ALKPHOS 91 10/13/2021   AST 14 10/13/2021   ALT 17 10/13/2021   PROT 7.0 10/13/2021   ALBUMIN 4.4 10/13/2021  CALCIUM 9.4 10/13/2021   EGFR 64 10/13/2021   Lab Results  Component Value Date   CHOL 209 (H) 12/13/2021   Lab Results  Component Value Date   HDL 37 (L) 12/13/2021   Lab Results  Component Value Date   LDLCALC 130 (H) 12/13/2021   Lab Results  Component Value Date   TRIG 236 (H) 12/13/2021   Lab Results  Component Value Date   CHOLHDL 5.6 (H) 12/13/2021   No results found for: "HGBA1C"    Assessment & Plan:   Problem List Items Addressed This Visit       Other   Chronic pain of left ankle - Primary    Chronic condition he was referred to orthopedics during his last office visit but states that he did not receive the calls from orthopedics.  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     Toradol 30 mg one-time injection, Solu-Medrol 40 mg 1 time injection given in the office today  RX flexeril 5 mg 3 times daily as needed Continue meloxicam 15 mg daily. Referral sent to orthopedics patient to that he should be expecting a call from them.       Relevant Medications   cyclobenzaprine (FLEXERIL) 5 MG tablet   Other Relevant Orders   Ambulatory referral to Orthopedic Surgery   Tobacco abuse counseling    Smokes about 3 cigarettes day  Asked about quitting: confirms that he/she currently smokes cigarettes Advise to quit smoking: Educated about QUITTING to reduce the risk of cancer, cardio and cerebrovascular disease. Assess willingness: Unwilling to quit at this time, but is working on cutting back. Assist with counseling and pharmacotherapy: Counseled for 5 minutes. Arrange for follow up: follow up in 3 months and continue to offer help.        Meds ordered this encounter  Medications   cyclobenzaprine (FLEXERIL) 5 MG tablet    Sig: Take 1 tablet (5 mg total) by  mouth 3 (three) times daily as needed for muscle spasms.    Dispense:  30 tablet    Refill:  0   ketorolac (TORADOL) 30 MG/ML injection 30 mg   methylPREDNISolone sodium succinate (SOLU-MEDROL) 40 mg/mL injection 40 mg    Follow-up: Return in about 4 months (around 05/22/2022) for ankle pain.    Renee Rival, FNP

## 2022-01-21 NOTE — Assessment & Plan Note (Addendum)
Smokes about 3 cigarettes day  Asked about quitting: confirms that he/she currently smokes cigarettes Advise to quit smoking: Educated about QUITTING to reduce the risk of cancer, cardio and cerebrovascular disease. Assess willingness: Unwilling to quit at this time, but is working on cutting back. Assist with counseling and pharmacotherapy: Counseled for 5 minutes. Arrange for follow up: follow up in 3 months and continue to offer help.

## 2022-01-28 ENCOUNTER — Other Ambulatory Visit: Payer: Self-pay | Admitting: Nurse Practitioner

## 2022-01-28 DIAGNOSIS — G8929 Other chronic pain: Secondary | ICD-10-CM

## 2022-02-01 ENCOUNTER — Ambulatory Visit: Payer: Self-pay | Admitting: Orthopaedic Surgery

## 2022-02-17 ENCOUNTER — Other Ambulatory Visit: Payer: Self-pay

## 2022-02-17 ENCOUNTER — Other Ambulatory Visit: Payer: Self-pay | Admitting: Nurse Practitioner

## 2022-02-17 ENCOUNTER — Emergency Department (HOSPITAL_COMMUNITY)
Admission: EM | Admit: 2022-02-17 | Discharge: 2022-02-17 | Disposition: A | Discharge status: Home / Self Care | Payer: Self-pay | Attending: Emergency Medicine | Admitting: Emergency Medicine

## 2022-02-17 ENCOUNTER — Emergency Department (HOSPITAL_COMMUNITY): Payer: Self-pay

## 2022-02-17 DIAGNOSIS — Z20822 Contact with and (suspected) exposure to covid-19: Secondary | ICD-10-CM | POA: Insufficient documentation

## 2022-02-17 DIAGNOSIS — J101 Influenza due to other identified influenza virus with other respiratory manifestations: Secondary | ICD-10-CM | POA: Insufficient documentation

## 2022-02-17 DIAGNOSIS — G8929 Other chronic pain: Secondary | ICD-10-CM

## 2022-02-17 LAB — RESP PANEL BY RT-PCR (FLU A&B, COVID) ARPGX2
Influenza A by PCR: POSITIVE — AB
Influenza B by PCR: NEGATIVE
SARS Coronavirus 2 by RT PCR: NEGATIVE

## 2022-02-17 MED ORDER — ACETAMINOPHEN 500 MG PO TABS
1000.0000 mg | ORAL_TABLET | Freq: Once | ORAL | Status: AC
  Administered 2022-02-17: 1000 mg via ORAL
  Filled 2022-02-17: qty 2

## 2022-02-17 MED ORDER — IBUPROFEN 800 MG PO TABS
800.0000 mg | ORAL_TABLET | Freq: Once | ORAL | Status: AC
  Administered 2022-02-17: 800 mg via ORAL
  Filled 2022-02-17: qty 1

## 2022-02-17 MED ORDER — OSELTAMIVIR PHOSPHATE 75 MG PO CAPS: 75.0000 mg | ORAL_CAPSULE | Freq: Two times a day (BID) | ORAL | 0 refills | Status: DC

## 2022-02-17 MED ORDER — BENZONATATE 100 MG PO CAPS: 100.0000 mg | ORAL_CAPSULE | Freq: Two times a day (BID) | ORAL | 0 refills | Status: DC | PRN

## 2022-02-17 NOTE — ED Provider Notes (Signed)
[facility] Emergency Department Provider Note MRN:  [id number]  Arrival date & time: 02/17/22     Chief Complaint   Cough   History of Present Illness   Brandon Grant is a 51 y.o. year-old male presents to the ED with chief complaint of cough, body aches, and fever.  Onset 2 days ago.  States he's never felt this bad.  States he has a dry cough that keeps him up all night. Denies any kidney or liver problems.  History provided by patient.   Review of Systems  Pertinent positive and negative review of systems noted in HPI.    Physical Exam   Vitals:   02/17/22 0633 02/17/22 0635  BP:    Pulse: (!) 112   Resp:    Temp:  98.9 F (37.2 C)  SpO2: 93%     CONSTITUTIONAL:  non toxic-appearing, NAD NEURO:  Alert and oriented x 3, CN 3-12 grossly intact EYES:  eyes equal and reactive ENT/NECK:  Supple, no stridor  CARDIO:  tachycardic, regular rhythm, appears well-perfused  PULM:  No respiratory distress, CTAB GI/GU:  non-distended,  MSK/SPINE:  No gross deformities, no edema, moves all extremities  SKIN:  no rash, atraumatic   *Additional and/or pertinent findings included in MDM below  Diagnostic and Interventional Summary    EKG Interpretation  Date/Time:    Ventricular Rate:    PR Interval:    QRS Duration:   QT Interval:    QTC Calculation:   R Axis:     Text Interpretation:         Labs Reviewed  RESP PANEL BY RT-PCR (FLU A&B, COVID) ARPGX2 - Abnormal; Notable for the following components:      Result Value   Influenza A by PCR POSITIVE (*)    All other components within normal limits    DG Chest 2 View  Final Result      Medications  acetaminophen (TYLENOL) tablet 1,000 mg (1,000 mg Oral Given 02/17/22 0457)  ibuprofen (ADVIL) tablet 800 mg (800 mg Oral Given 02/17/22 7106)     Procedures  /  Critical Care Procedures  ED Course and Medical Decision Making  I have reviewed the triage vital signs, the nursing notes,  and pertinent available records from the EMR.  Social Determinants Affecting Complexity of Care: Patient has no clinically significant social determinants affecting this chief complaint..   ED Course:    Medical Decision Making Patient here with flu-like symptoms.  Will check flu test.  Denis hx of asthma, COPD, kidney or liver problems.  Flu A positive.  Fever and HR down trending after tylenol.  Will give dose of Tamiflu here.    6:35 AM Temp now 98.9.  HR 112.  DC to home with supportive care and tamiflu.  Amount and/or Complexity of Data Reviewed Radiology: ordered.  Risk OTC drugs. Prescription drug management.     Consultants: No consultations were needed in caring for this patient.   Treatment and Plan: Emergency department workup does not suggest an emergent condition requiring admission or immediate intervention beyond  what has been performed at this time. The patient is safe for discharge and has  been instructed to return immediately for worsening symptoms, change in  symptoms or any other concerns    Final Clinical Impressions(s) / ED Diagnoses     ICD-10-CM   1. Influenza A  J10.1       ED Discharge Orders  Ordered    oseltamivir (TAMIFLU) 75 MG capsule  Every 12 hours        02/17/22 0609    benzonatate (TESSALON) 100 MG capsule  2 times daily PRN        02/17/22 0609              Discharge Instructions Discussed with and Provided to Patient:   Discharge Instructions   None      Roxy Horseman, PA-C 02/17/22 [id number]    Shon Baton, MD 02/17/22 [phone]

## 2022-02-17 NOTE — ED Triage Notes (Signed)
Patient reports productive cough /sneezing with chest congestion and tightness for 2 days . Febrile at triage .

## 2022-02-17 NOTE — ED Provider Triage Note (Signed)
  Emergency Medicine Provider Triage Evaluation Note  MRN:  [id number]  Arrival date & time: 02/17/22    Medically screening exam initiated at 4:55 AM.   CC:   Cough   HPI:  Brandon Grant is a 51 y.o. year-old male presents to the ED with chief complaint of cough, body aches, and fever.  Onset 2 days ago.  States he's never felt this bad.  States he has a dry cough that keeps him up all night.  History provided by patient. ROS:  -As included in HPI PE:   Vitals:   02/17/22 0453  BP: (!) 130/94  Pulse: (!) 136  Resp: 20  Temp: (!) 102.8 F (39.3 C)  SpO2: 94%    Non-toxic appearing No respiratory distress  MDM:  Based on signs and symptoms, flu is highest on my differential, followed by covid. I've ordered flu and CXR in triage to expedite lab/diagnostic workup.  Patient was informed that the remainder of the evaluation will be completed by another provider, this initial triage assessment does not replace that evaluation, and the importance of remaining in the ED until their evaluation is complete.    Roxy Horseman, PA-C 02/17/22 [phone]

## 2022-02-18 ENCOUNTER — Telehealth: Payer: Self-pay

## 2022-02-18 ENCOUNTER — Telehealth: Payer: Self-pay | Admitting: Nurse Practitioner

## 2022-02-18 NOTE — Telephone Encounter (Signed)
Caller & Relationship to patient: Brandon Grant from [facility]    MRN #  [id number]   Call Back Number:  Date of Last Office Visit: 02/18/2022     Date of Next Office Visit: 02/21/2022    Medication(s) to be Refilled: Benzonatate and Oseltamivir   Preferred Pharmacy:   ** Please notify patient to allow 48-72 hours to process** **Let patient know to contact pharmacy at the end of the day to make sure medication is ready. ** **If patient has not been seen in a year or longer, book an appointment **Advise to use MyChart for refill requests OR to contact their pharmacy

## 2022-02-18 NOTE — Telephone Encounter (Signed)
Transition Care Management Follow-up Telephone Call Date of discharge and from where: 02/17/22 How have you been since you were released from the hospital? Pt has had little improvement  Any questions or concerns? No  Items Reviewed: Did the pt receive and understand the discharge instructions provided? Yes  Medications obtained and verified? Yes  Other? No  Any new allergies since your discharge? No  Dietary orders reviewed? No Do you have support at home? Yes    Follow up appointments reviewed:  PCP Hospital f/u appt confirmed? Yes  Scheduled to see [place] on 02/22/22 @ 3pm. Specialist Hospital f/u appt confirmed? No   Are transportation arrangements needed? No  If their condition worsens, is the pt aware to call PCP or go to the Emergency Dept.? Yes Was the patient provided with contact information for the PCP's office or ED? Yes Was to pt encouraged to call back with questions or concerns? Yes    Renelda Loma RMA

## 2022-02-21 ENCOUNTER — Inpatient Hospital Stay: Payer: Self-pay | Admitting: Nurse Practitioner

## 2022-02-21 NOTE — Telephone Encounter (Signed)
Ordered On: 12/07/2023ReportPharmacy: [id number] - [place], [place] - [address] AT [facility] [address]

## 2022-02-22 ENCOUNTER — Ambulatory Visit (INDEPENDENT_AMBULATORY_CARE_PROVIDER_SITE_OTHER): Payer: Self-pay | Admitting: Nurse Practitioner

## 2022-02-22 ENCOUNTER — Encounter: Payer: Self-pay | Admitting: Nurse Practitioner

## 2022-02-22 VITALS — BP 129/64 | HR 88 | Temp 98.2°F | Ht 72.0 in | Wt 212.0 lb

## 2022-02-22 DIAGNOSIS — G8929 Other chronic pain: Secondary | ICD-10-CM

## 2022-02-22 DIAGNOSIS — Z720 Tobacco use: Secondary | ICD-10-CM

## 2022-02-22 DIAGNOSIS — M25572 Pain in left ankle and joints of left foot: Secondary | ICD-10-CM

## 2022-02-22 DIAGNOSIS — J101 Influenza due to other identified influenza virus with other respiratory manifestations: Secondary | ICD-10-CM

## 2022-02-22 MED ORDER — CYCLOBENZAPRINE HCL 5 MG PO TABS: 5.0000 mg | ORAL_TABLET | Freq: Three times a day (TID) | ORAL | 0 refills | Status: DC | PRN

## 2022-02-22 MED ORDER — IBUPROFEN 600 MG PO TABS: 600.0000 mg | ORAL_TABLET | Freq: Three times a day (TID) | ORAL | 0 refills | Status: DC | PRN

## 2022-02-22 NOTE — Assessment & Plan Note (Addendum)
Smokes 3 cigarettes daily Still working on smoking cessation need to quit smoking including risk of lung cancer COPD discussed.  Patient encouraged to intensify his efforts at smoking cessation.

## 2022-02-22 NOTE — Assessment & Plan Note (Signed)
Tested positive on 12/7 at the ER Started taking tamiflu 75mg  BID yesterday , patient was encouraged to continue the medications ordered He has been taking robitussin for cough.  He was encouraged to drink at least 64 ounces of water daily to maintain hydration.

## 2022-02-22 NOTE — Assessment & Plan Note (Signed)
1. Chronic pain of left ankle  - cyclobenzaprine (FLEXERIL) 5 MG tablet; Take 1 tablet (5 mg total) by mouth 3 (three) times daily as needed for muscle spasms.  Dispense: 30 tablet; Refill: 0 - ibuprofen (ADVIL) 600 MG tablet; Take 1 tablet (600 mg total) by mouth every 8 (eight) hours as needed.alternate with tylenol 650mg  every 6 hours as needed.  Continue to wear ankle brace  Medications refilled  Did not follow up with otho because he can not afford the copay

## 2022-02-22 NOTE — Progress Notes (Signed)
Established Patient Office Visit  Subjective:  Patient ID: Brandon Grant, male    DOB: March 08, 1971  Age: 51 y.o. MRN: [id number]  CC:  Chief Complaint  Patient presents with   Hospitalization Follow-up    Pt stated--still having coughing green mucus, but no fever/ chest pain.    HPI Brandon Grant is a 51 y.o. male with past medical history of chronic pain of left ankle tobacco abuse presents for follow-up to the emergency room visit on December 7,2023.  For complaints of cough, body aches and fever.  He was found to have influenza A, Tamiflu and Tessalon will ordered by the emergency room doctor.  Patient stated that his cough has improved, currently denies fever, chills, shortness of breath, chest pain, body aches.  He started taking Tamiflu 75 mg twice daily yesterday, has been taking over-the-counter Robitussin as needed for cough  Did not follow up with otho for his chronic left ankle pain due to cost of copay. Need refills of flexeril and ibuprofen.      History reviewed. No pertinent past medical history.  History reviewed. No pertinent surgical history.  Family History  Problem Relation Age of Onset   Cancer Mother    Cancer Father     Social History   Socioeconomic History   Marital status: Single    Spouse name: Not on file   Number of children: Not on file   Years of education: Not on file   Highest education level: Not on file  Occupational History   Not on file  Tobacco Use   Smoking status: Every Day   Smokeless tobacco: Not on file  Vaping Use   Vaping Use: Never used  Substance and Sexual Activity   Alcohol use: No   Drug use: No   Sexual activity: Yes  Other Topics Concern   Not on file  Social History Narrative   Not on file   Social Determinants of Health   Financial Resource Strain: Not on file  Food Insecurity: Not on file  Transportation Needs: Not on file  Physical Activity: Not on file  Stress: Not on file  Social Connections: Not  on file  Intimate Partner Violence: Not on file    Outpatient Medications Prior to Visit  Medication Sig Dispense Refill   acetaminophen (TYLENOL) 500 MG tablet Take 1 tablet (500 mg total) by mouth every 6 (six) hours as needed. 30 tablet 0   omeprazole (PRILOSEC) 20 MG capsule Take 1 capsule (20 mg total) by mouth daily. 30 capsule 3   oseltamivir (TAMIFLU) 75 MG capsule Take 1 capsule (75 mg total) by mouth every 12 (twelve) hours. 10 capsule 0   cyclobenzaprine (FLEXERIL) 5 MG tablet Take 1 tablet (5 mg total) by mouth 3 (three) times daily as needed for muscle spasms. 30 tablet 0   ibuprofen (ADVIL,MOTRIN) 600 MG tablet Take 1 tablet (600 mg total) by mouth every 6 (six) hours as needed. 30 tablet 0   meloxicam (MOBIC) 15 MG tablet Take 1 tablet (15 mg total) by mouth daily. 30 tablet 0   benzonatate (TESSALON) 100 MG capsule Take 1 capsule (100 mg total) by mouth 2 (two) times daily as needed for cough. (Patient not taking: Reported on 02/22/2022) 20 capsule 0   No facility-administered medications prior to visit.    No Known Allergies  ROS Review of Systems  Constitutional:  Negative for activity change, appetite change, chills, diaphoresis, fatigue, fever and unexpected weight change.  Respiratory:  Positive  for cough. Negative for choking, chest tightness, shortness of breath, wheezing and stridor.   Cardiovascular: Negative.  Negative for chest pain, palpitations and leg swelling.  Musculoskeletal:  Positive for arthralgias.  Neurological:  Negative for dizziness, facial asymmetry, light-headedness, numbness and headaches.  Psychiatric/Behavioral:  Negative for agitation, behavioral problems, confusion, self-injury, sleep disturbance and suicidal ideas.       Objective:    Physical Exam Constitutional:      General: He is not in acute distress.    Appearance: Normal appearance. He is not ill-appearing, toxic-appearing or diaphoretic.  HENT:     Nose: Nose normal. No  congestion or rhinorrhea.     Mouth/Throat:     Mouth: Mucous membranes are moist.     Pharynx: Oropharynx is clear. No oropharyngeal exudate or posterior oropharyngeal erythema.  Eyes:     General: No scleral icterus.       Right eye: No discharge.        Left eye: No discharge.     Conjunctiva/sclera: Conjunctivae normal.  Cardiovascular:     Rate and Rhythm: Normal rate and regular rhythm.     Pulses: Normal pulses.     Heart sounds: Normal heart sounds. No murmur heard.    No friction rub. No gallop.  Pulmonary:     Effort: Pulmonary effort is normal. No respiratory distress.     Breath sounds: Normal breath sounds. No stridor. No wheezing, rhonchi or rales.  Chest:     Chest wall: No tenderness.  Abdominal:     General: There is no distension.     Palpations: Abdomen is soft.     Tenderness: There is no abdominal tenderness.  Musculoskeletal:        General: Tenderness present. No swelling, deformity or signs of injury.     Right lower leg: No edema.     Left lower leg: No edema.     Comments: Tenderness on ROM of left ankle, skin warm and dry no swelling noted, has palpable pedal pulse.   Skin:    General: Skin is warm and dry.  Neurological:     Mental Status: He is alert.     BP 129/64   Pulse 88   Temp 98.2 F (36.8 C)   Ht 6' (1.829 m)   Wt 212 lb (96.2 kg)   SpO2 97%   BMI 28.75 kg/m  Wt Readings from Last 3 Encounters:  02/22/22 212 lb (96.2 kg)  01/21/22 217 lb 3.2 oz (98.5 kg)  12/13/21 206 lb 0.4 oz (93.5 kg)    Lab Results  Component Value Date   TSH 1.380 12/13/2021   Lab Results  Component Value Date   WBC 7.5 12/13/2021   HGB 14.8 12/13/2021   HCT 42.2 12/13/2021   MCV 89 12/13/2021   PLT 218 12/13/2021   Lab Results  Component Value Date   NA 140 10/13/2021   K 4.3 10/13/2021   CO2 24 10/13/2021   GLUCOSE 90 10/13/2021   BUN 14 10/13/2021   CREATININE 1.37 (H) 10/13/2021   BILITOT 0.3 10/13/2021   ALKPHOS 91 10/13/2021   AST  14 10/13/2021   ALT 17 10/13/2021   PROT 7.0 10/13/2021   ALBUMIN 4.4 10/13/2021   CALCIUM 9.4 10/13/2021   EGFR 64 10/13/2021   Lab Results  Component Value Date   CHOL 209 (H) 12/13/2021   Lab Results  Component Value Date   HDL 37 (L) 12/13/2021   Lab Results  Component Value  Date   LDLCALC 130 (H) 12/13/2021   Lab Results  Component Value Date   TRIG 236 (H) 12/13/2021   Lab Results  Component Value Date   CHOLHDL 5.6 (H) 12/13/2021   No results found for: "HGBA1C"    Assessment & Plan:   Problem List Items Addressed This Visit       Respiratory   Influenza A - Primary    Tested positive on 12/7 at the ER Started taking tamiflu 81m BID yesterday , patient was encouraged to continue the medications ordered He has been taking robitussin for cough.  He was encouraged to drink at least 64 ounces of water daily to maintain hydration.          Other   Chronic pain of left ankle    1. Chronic pain of left ankle  - cyclobenzaprine (FLEXERIL) 5 MG tablet; Take 1 tablet (5 mg total) by mouth 3 (three) times daily as needed for muscle spasms.  Dispense: 30 tablet; Refill: 0 - ibuprofen (ADVIL) 600 MG tablet; Take 1 tablet (600 mg total) by mouth every 8 (eight) hours as needed.alternate with tylenol 6536mevery 6 hours as needed.  Continue to wear ankle brace  Medications refilled  Did not follow up with otho because he can not afford the copay      Relevant Medications   cyclobenzaprine (FLEXERIL) 5 MG tablet   ibuprofen (ADVIL) 600 MG tablet   Tobacco abuse    Smokes 3 cigarettes daily Still working on smoking cessation need to quit smoking including risk of lung cancer COPD discussed.  Patient encouraged to intensify his efforts at smoking cessation.        Meds ordered this encounter  Medications   cyclobenzaprine (FLEXERIL) 5 MG tablet    Sig: Take 1 tablet (5 mg total) by mouth 3 (three) times daily as needed for muscle spasms.    Dispense:  30  tablet    Refill:  0   ibuprofen (ADVIL) 600 MG tablet    Sig: Take 1 tablet (600 mg total) by mouth every 8 (eight) hours as needed.    Dispense:  30 tablet    Refill:  0    Follow-up: No follow-ups on file.    FoRenee RivalFNP

## 2022-02-22 NOTE — Patient Instructions (Signed)

## 2022-03-23 ENCOUNTER — Other Ambulatory Visit: Payer: Self-pay | Admitting: Nurse Practitioner

## 2022-03-23 DIAGNOSIS — K219 Gastro-esophageal reflux disease without esophagitis: Secondary | ICD-10-CM

## 2022-03-25 ENCOUNTER — Other Ambulatory Visit: Payer: Self-pay | Admitting: Nurse Practitioner

## 2022-03-25 ENCOUNTER — Telehealth: Payer: Self-pay | Admitting: Nurse Practitioner

## 2022-03-25 DIAGNOSIS — K219 Gastro-esophageal reflux disease without esophagitis: Secondary | ICD-10-CM

## 2022-03-25 MED ORDER — OMEPRAZOLE 20 MG PO CPDR: 20.0000 mg | DELAYED_RELEASE_CAPSULE | Freq: Every day | ORAL | 3 refills | Status: DC

## 2022-03-25 NOTE — Telephone Encounter (Signed)
Caller & Relationship to patient: pt  MRN #  [id number]   Call Back Number: [phone]  Date of Last Office Visit: 03/23/2022     Date of Next Office Visit: 05/20/2022    Medication(s) to be Refilled: omeprazole  Preferred Pharmacy: [facility]   ** Please notify patient to allow 48-72 hours to process** **Let patient know to contact pharmacy at the end of the day to make sure medication is ready. ** **If patient has not been seen in a year or longer, book an appointment **Advise to use MyChart for refill requests OR to contact their pharmacy

## 2022-03-28 ENCOUNTER — Other Ambulatory Visit: Payer: Self-pay

## 2022-03-28 DIAGNOSIS — K219 Gastro-esophageal reflux disease without esophagitis: Secondary | ICD-10-CM

## 2022-03-28 MED ORDER — OMEPRAZOLE 20 MG PO CPDR: 20.0000 mg | DELAYED_RELEASE_CAPSULE | Freq: Every day | ORAL | 1 refills | Status: DC

## 2022-03-28 NOTE — Telephone Encounter (Signed)
Done KH 

## 2022-04-12 ENCOUNTER — Encounter: Payer: Self-pay | Admitting: Nurse Practitioner

## 2022-04-12 ENCOUNTER — Ambulatory Visit (INDEPENDENT_AMBULATORY_CARE_PROVIDER_SITE_OTHER): Payer: BLUE CROSS/BLUE SHIELD | Admitting: Nurse Practitioner

## 2022-04-12 VITALS — BP 108/59 | HR 77 | Temp 98.1°F | Resp 17 | Wt 225.8 lb

## 2022-04-12 DIAGNOSIS — Z720 Tobacco use: Secondary | ICD-10-CM

## 2022-04-12 DIAGNOSIS — G8929 Other chronic pain: Secondary | ICD-10-CM | POA: Diagnosis not present

## 2022-04-12 DIAGNOSIS — M25572 Pain in left ankle and joints of left foot: Secondary | ICD-10-CM | POA: Diagnosis not present

## 2022-04-12 MED ORDER — IBUPROFEN 600 MG PO TABS: 600.0000 mg | ORAL_TABLET | Freq: Three times a day (TID) | ORAL | 0 refills | Status: DC | PRN

## 2022-04-12 MED ORDER — CYCLOBENZAPRINE HCL 5 MG PO TABS: 5.0000 mg | ORAL_TABLET | Freq: Three times a day (TID) | ORAL | 0 refills | Status: DC | PRN

## 2022-04-12 NOTE — Assessment & Plan Note (Addendum)
-   ibuprofen (ADVIL) 600 MG tablet; Take 1 tablet (600 mg total) by mouth every 8 (eight) hours as needed.  Dispense: 30 tablet; Refill: 0 - cyclobenzaprine (FLEXERIL) 5 MG tablet; Take 1 tablet (5 mg total) by mouth 3 (three) times daily as needed for muscle spasms.  Dispense: 30 tablet; Refill: 0  Again patient was encouraged to follow-up orthopedics, continue ankle brace  Continue ibuprofen and Flexeril as needed

## 2022-04-12 NOTE — Progress Notes (Signed)
Established Patient Office Visit  Subjective:  Patient ID: Brandon Grant, male    DOB: [birth year]  Age: 52 y.o. MRN: [id number]  CC: No chief complaint on file.   HPI Brandon Grant is a 52 y.o. male with past medical history of chronic pain of left ankle, tobacco abuse who presents for follow-up for his chronic left ankle pain.  He continues to have pain on his left ankle pain is worse with ambulation.  He has been taking ibuprofen and Flexeril as needed, with ankle brace regularly.  He was referred to orthopedics but he did not follow-up due to concerns with pain for the visit.  He denies fever, chills, malaise, shortness of breath, chest pain, tingling, numbness,.         History reviewed. No pertinent past medical history.  History reviewed. No pertinent surgical history.  Family History  Problem Relation Age of Onset   Cancer Mother    Cancer Father     Social History   Socioeconomic History   Marital status: Single    Spouse name: Not on file   Number of children: Not on file   Years of education: Not on file   Highest education level: Not on file  Occupational History   Not on file  Tobacco Use   Smoking status: Every Day   Smokeless tobacco: Not on file  Vaping Use   Vaping Use: Never used  Substance and Sexual Activity   Alcohol use: No   Drug use: No   Sexual activity: Yes  Other Topics Concern   Not on file  Social History Narrative   Not on file   Social Determinants of Health   Financial Resource Strain: Not on file  Food Insecurity: Not on file  Transportation Needs: Not on file  Physical Activity: Not on file  Stress: Not on file  Social Connections: Not on file  Intimate Partner Violence: Not on file    Outpatient Medications Prior to Visit  Medication Sig Dispense Refill   cyclobenzaprine (FLEXERIL) 5 MG tablet Take 1 tablet (5 mg total) by mouth 3 (three) times daily as needed for muscle spasms. 30 tablet 0   acetaminophen  (TYLENOL) 500 MG tablet Take 1 tablet (500 mg total) by mouth every 6 (six) hours as needed. (Patient not taking: Reported on 04/12/2022) 30 tablet 0   omeprazole (PRILOSEC) 20 MG capsule Take 1 capsule (20 mg total) by mouth daily. 90 capsule 1   benzonatate (TESSALON) 100 MG capsule Take 1 capsule (100 mg total) by mouth 2 (two) times daily as needed for cough. (Patient not taking: Reported on 02/22/2022) 20 capsule 0   ibuprofen (ADVIL) 600 MG tablet Take 1 tablet (600 mg total) by mouth every 8 (eight) hours as needed. (Patient not taking: Reported on 04/12/2022) 30 tablet 0   oseltamivir (TAMIFLU) 75 MG capsule Take 1 capsule (75 mg total) by mouth every 12 (twelve) hours. (Patient not taking: Reported on 04/12/2022) 10 capsule 0   No facility-administered medications prior to visit.    No Known Allergies  ROS Review of Systems  Constitutional:  Negative for activity change, appetite change, chills, diaphoresis, fatigue and fever.  HENT: Negative.    Respiratory: Negative.  Negative for apnea, cough, choking, chest tightness, shortness of breath, wheezing and stridor.   Cardiovascular: Negative.  Negative for chest pain, palpitations and leg swelling.  Gastrointestinal: Negative.  Negative for abdominal distention, abdominal pain and anal bleeding.  Musculoskeletal:  Negative for back pain,  gait problem, joint swelling, myalgias, neck pain and neck stiffness.  Skin: Negative.  Negative for color change and pallor.  Neurological:  Negative for dizziness, seizures, facial asymmetry, light-headedness, numbness and headaches.  Psychiatric/Behavioral:  Negative for agitation, behavioral problems, confusion, decreased concentration, dysphoric mood, self-injury, sleep disturbance and suicidal ideas.       Objective:    Physical Exam Constitutional:      General: He is not in acute distress.    Appearance: He is not ill-appearing, toxic-appearing or diaphoretic.  Eyes:     General: No  scleral icterus.       Right eye: No discharge.        Left eye: No discharge.     Extraocular Movements: Extraocular movements intact.  Cardiovascular:     Rate and Rhythm: Normal rate and regular rhythm.     Pulses: Normal pulses.     Heart sounds: Normal heart sounds. No murmur heard.    No friction rub. No gallop.  Pulmonary:     Effort: Pulmonary effort is normal. No respiratory distress.     Breath sounds: Normal breath sounds. No stridor. No wheezing, rhonchi or rales.  Chest:     Chest wall: No tenderness.  Abdominal:     General: There is no distension.     Palpations: Abdomen is soft.     Tenderness: There is no abdominal tenderness. There is no guarding.  Musculoskeletal:        General: Tenderness present. No deformity or signs of injury.     Right lower leg: No edema.     Left lower leg: No edema.     Comments: tenderness on range of motion of left ankle, skin warm and dry no redness or swelling noted has palpable pedal pulses , ankle brace in place  Skin:    General: Skin is warm and dry.     Capillary Refill: Capillary refill takes less than 2 seconds.  Neurological:     Mental Status: He is alert and oriented to person, place, and time.     Cranial Nerves: No cranial nerve deficit.     Sensory: No sensory deficit.     Motor: No weakness.     Coordination: Coordination normal.     Gait: Gait normal.  Psychiatric:        Mood and Affect: Mood normal.        Behavior: Behavior normal.        Thought Content: Thought content normal.        Judgment: Judgment normal.     BP (!) 108/59   Pulse 77   Temp 98.1 F (36.7 C)   Resp 17   Wt 225 lb 12.8 oz (102.4 kg)   SpO2 99%   BMI 30.62 kg/m  Wt Readings from Last 3 Encounters:  04/12/22 225 lb 12.8 oz (102.4 kg)  02/22/22 212 lb (96.2 kg)  01/21/22 217 lb 3.2 oz (98.5 kg)    Lab Results  Component Value Date   TSH 1.380 12/13/2021   Lab Results  Component Value Date   WBC 7.5 12/13/2021   HGB 14.8  12/13/2021   HCT 42.2 12/13/2021   MCV 89 12/13/2021   PLT 218 12/13/2021   Lab Results  Component Value Date   NA 140 10/13/2021   K 4.3 10/13/2021   CO2 24 10/13/2021   GLUCOSE 90 10/13/2021   BUN 14 10/13/2021   CREATININE 1.37 (H) 10/13/2021   BILITOT 0.3 10/13/2021  ALKPHOS 91 10/13/2021   AST 14 10/13/2021   ALT 17 10/13/2021   PROT 7.0 10/13/2021   ALBUMIN 4.4 10/13/2021   CALCIUM 9.4 10/13/2021   EGFR 64 10/13/2021   Lab Results  Component Value Date   CHOL 209 (H) 12/13/2021   Lab Results  Component Value Date   HDL 37 (L) 12/13/2021   Lab Results  Component Value Date   LDLCALC 130 (H) 12/13/2021   Lab Results  Component Value Date   TRIG 236 (H) 12/13/2021   Lab Results  Component Value Date   CHOLHDL 5.6 (H) 12/13/2021   No results found for: "HGBA1C"    Assessment & Plan:   Problem List Items Addressed This Visit       Other   Chronic pain of left ankle - Primary     - ibuprofen (ADVIL) 600 MG tablet; Take 1 tablet (600 mg total) by mouth every 8 (eight) hours as needed.  Dispense: 30 tablet; Refill: 0 - cyclobenzaprine (FLEXERIL) 5 MG tablet; Take 1 tablet (5 mg total) by mouth 3 (three) times daily as needed for muscle spasms.  Dispense: 30 tablet; Refill: 0  Again patient was encouraged to follow-up orthopedics, continue ankle brace  Continue ibuprofen and Flexeril as needed       Relevant Medications   ibuprofen (ADVIL) 600 MG tablet   cyclobenzaprine (FLEXERIL) 5 MG tablet   Tobacco abuse    He has cut down to 2 cigarettes, patient congratulated on this; he was encouraged to consider quitting completely due to risk of COPD, lung cancer,       Meds ordered this encounter  Medications   ibuprofen (ADVIL) 600 MG tablet    Sig: Take 1 tablet (600 mg total) by mouth every 8 (eight) hours as needed.    Dispense:  30 tablet    Refill:  0   cyclobenzaprine (FLEXERIL) 5 MG tablet    Sig: Take 1 tablet (5 mg total) by mouth 3  (three) times daily as needed for muscle spasms.    Dispense:  30 tablet    Refill:  0    Follow-up: Return in about 1 year (around 04/13/2023).    Renee Rival, FNP

## 2022-04-12 NOTE — Assessment & Plan Note (Addendum)
He has cut down to 2 cigarettes, patient congratulated on this; he was encouraged to consider quitting completely due to risk of COPD, lung cancer,

## 2022-04-12 NOTE — Patient Instructions (Addendum)
[phone] othopedics office   1. Chronic pain of left ankle  - ibuprofen (ADVIL) 600 MG tablet; Take 1 tablet (600 mg total) by mouth every 8 (eight) hours as needed.  Dispense: 30 tablet; Refill: 0 - cyclobenzaprine (FLEXERIL) 5 MG tablet; Take 1 tablet (5 mg total) by mouth 3 (three) times daily as needed for muscle spasms.  Dispense: 30 tablet; Refill: 0    It is important that you exercise regularly at least 30 minutes 5 times a week as tolerated  Think about what you will eat, plan ahead. Choose " clean, green, fresh or frozen" over canned, processed or packaged foods which are more sugary, salty and fatty. 70 to 75% of food eaten should be vegetables and fruit. Three meals at set times with snacks allowed between meals, but they must be fruit or vegetables. Aim to eat over a 12 hour period , example 7 am to 7 pm, and STOP after  your last meal of the day. Drink water,generally about 64 ounces per day, no other drink is as healthy. Fruit juice is best enjoyed in a healthy way, by EATING the fruit.  Thanks for choosing [facility] we consider it a privelige to serve you.

## 2022-05-20 ENCOUNTER — Ambulatory Visit: Payer: Self-pay | Admitting: Nurse Practitioner

## 2022-05-23 ENCOUNTER — Telehealth: Payer: Self-pay

## 2022-05-23 ENCOUNTER — Other Ambulatory Visit: Payer: Self-pay | Admitting: Nurse Practitioner

## 2022-05-23 DIAGNOSIS — G8929 Other chronic pain: Secondary | ICD-10-CM

## 2022-05-23 MED ORDER — IBUPROFEN 600 MG PO TABS: 600.0000 mg | ORAL_TABLET | Freq: Three times a day (TID) | ORAL | 0 refills | Status: DC | PRN

## 2022-05-23 NOTE — Telephone Encounter (Signed)
Pt is requesting to have meloxicam fill. Please advise. I do not see it I pt med list , but per pt he has. Please advise [facility]

## 2022-09-12 DIAGNOSIS — Z419 Encounter for procedure for purposes other than remedying health state, unspecified: Secondary | ICD-10-CM | POA: Diagnosis not present

## 2022-09-28 ENCOUNTER — Encounter: Payer: Self-pay | Admitting: Nurse Practitioner

## 2022-09-28 ENCOUNTER — Ambulatory Visit (INDEPENDENT_AMBULATORY_CARE_PROVIDER_SITE_OTHER): Payer: BLUE CROSS/BLUE SHIELD | Admitting: Nurse Practitioner

## 2022-09-28 DIAGNOSIS — G8929 Other chronic pain: Secondary | ICD-10-CM | POA: Diagnosis not present

## 2022-09-28 DIAGNOSIS — M25572 Pain in left ankle and joints of left foot: Secondary | ICD-10-CM

## 2022-09-28 MED ORDER — KETOROLAC TROMETHAMINE 30 MG/ML IJ SOLN
30.0000 mg | Freq: Once | INTRAMUSCULAR | Status: AC
  Administered 2022-09-28: 30 mg via INTRAMUSCULAR

## 2022-09-28 MED ORDER — IBUPROFEN 600 MG PO TABS: 600.0000 mg | ORAL_TABLET | Freq: Three times a day (TID) | ORAL | 0 refills | Status: DC | PRN

## 2022-09-28 MED ORDER — CYCLOBENZAPRINE HCL 5 MG PO TABS: 5.0000 mg | ORAL_TABLET | Freq: Three times a day (TID) | ORAL | 0 refills | Status: DC | PRN

## 2022-09-28 NOTE — Patient Instructions (Addendum)
Please get your TDAP vaccine at the pharmacy Come fasting at your next appointment for an annual physical  1. Chronic pain of left ankle You were given Toradol 30 mg IM injection today in the office  - cyclobenzaprine (FLEXERIL) 5 MG tablet; Take 1 tablet (5 mg total) by mouth 3 (three) times daily as needed for muscle spasms.  Dispense: 30 tablet; Refill: 0 - ibuprofen (ADVIL) 600 MG tablet; Take 1 tablet (600 mg total) by mouth every 8 (eight) hours as needed.  Dispense: 30 tablet; Refill: 0  Please take ibuprofen with meals to help prevent stomach upset.  Alternate ibuprofen with Tylenol 650 mg every 6 hours as needed. Please follow-up with orthopedics as discussed. You can get an ankle brace at the pharmacy.   It is important that you exercise regularly at least 30 minutes 5 times a week as tolerated  Think about what you will eat, plan ahead. Choose " clean, green, fresh or frozen" over canned, processed or packaged foods which are more sugary, salty and fatty. 70 to 75% of food eaten should be vegetables and fruit. Three meals at set times with snacks allowed between meals, but they must be fruit or vegetables. Aim to eat over a 12 hour period , example 7 am to 7 pm, and STOP after  your last meal of the day. Drink water,generally about 64 ounces per day, no other drink is as healthy. Fruit juice is best enjoyed in a healthy way, by EATING the fruit.  Thanks for choosing [facility] we consider it a privelige to serve you.

## 2022-09-28 NOTE — Progress Notes (Signed)
Acute Office Visit  Subjective:     Patient ID: Brandon Grant, male    DOB: Nov 08, 1970, 52 y.o.   MRN: [id number]  Chief Complaint  Patient presents with   Ankle Pain    Left , no known injury , started a month ago    HPI Brandon Grant is a 52 y.o. male with past medical history of chronic pain of left ankle, tobacco abuse who presents for for complaints of chronic left ankle pain.  Had ankle injury in 2015 and subsequently had a surgery to repair the injury.  He has been having pain on and off since then.  He is now willing to see an orthopedist since he now has an insurance.  He takes ibuprofen and Flexeril as needed but has been out of both medications.  Currently has aching pain rated 8/10.  He denies fever, chills, numbness, tingling, chest pain, shortness of breath.  He also presents with a Medicaid enrollment form, needs assistance completing the form.  Stated that is normal and living at [facility] house but was still like his medications sent to [facility] pharmacy.    Review of Systems  Constitutional:  Negative for activity change, appetite change, chills, diaphoresis, fatigue, fever and unexpected weight change.  HENT:  Negative for congestion, dental problem, drooling and ear discharge.   Eyes:  Negative for pain, discharge, redness and itching.  Respiratory:  Negative for apnea, cough, choking, chest tightness, shortness of breath and wheezing.   Cardiovascular: Negative.  Negative for chest pain, palpitations and leg swelling.  Gastrointestinal:  Negative for abdominal distention, abdominal pain, anal bleeding, blood in stool, constipation, diarrhea and vomiting.  Endocrine: Negative for polydipsia, polyphagia and polyuria.  Genitourinary:  Negative for difficulty urinating, flank pain, frequency and genital sores.  Musculoskeletal:  Positive for arthralgias. Negative for back pain, gait problem and joint swelling.  Skin:  Negative for color change, pallor and rash.   Neurological:  Negative for dizziness, facial asymmetry, light-headedness, numbness and headaches.  Psychiatric/Behavioral:  Negative for agitation, behavioral problems, confusion, hallucinations, self-injury, sleep disturbance and suicidal ideas.         Objective:    BP 115/60   Pulse 72   Temp (!) 97.4 F (36.3 C)   Wt 226 lb 9.6 oz (102.8 kg)   SpO2 98%   BMI 30.73 kg/m    Physical Exam Vitals and nursing note reviewed.  Constitutional:      General: He is not in acute distress.    Appearance: Normal appearance. He is obese. He is not ill-appearing, toxic-appearing or diaphoretic.  HENT:     Mouth/Throat:     Mouth: Mucous membranes are moist.     Pharynx: Oropharynx is clear. No oropharyngeal exudate or posterior oropharyngeal erythema.  Eyes:     General: No scleral icterus.       Right eye: No discharge.        Left eye: No discharge.     Extraocular Movements: Extraocular movements intact.     Conjunctiva/sclera: Conjunctivae normal.  Cardiovascular:     Rate and Rhythm: Normal rate and regular rhythm.     Pulses: Normal pulses.     Heart sounds: Normal heart sounds. No murmur heard.    No friction rub. No gallop.  Pulmonary:     Effort: Pulmonary effort is normal. No respiratory distress.     Breath sounds: Normal breath sounds. No stridor. No wheezing, rhonchi or rales.  Chest:     Chest wall:  No tenderness.  Abdominal:     General: There is no distension.     Palpations: Abdomen is soft.     Tenderness: There is no abdominal tenderness. There is no right CVA tenderness, left CVA tenderness or guarding.  Musculoskeletal:        General: Tenderness present. No swelling, deformity or signs of injury.     Right lower leg: No edema.     Left lower leg: No edema.     Comments: Tenderness on range of motion of left ankle, skin warm and dry no redness or swelling noted.  Has palpable pedal pulses  Skin:    General: Skin is warm and dry.     Capillary Refill:  Capillary refill takes less than 2 seconds.     Coloration: Skin is not jaundiced or pale.     Findings: No bruising, erythema or lesion.  Neurological:     Mental Status: He is alert and oriented to person, place, and time.     Motor: No weakness.     Coordination: Coordination normal.     Gait: Gait normal.  Psychiatric:        Mood and Affect: Mood normal.        Behavior: Behavior normal.        Thought Content: Thought content normal.        Judgment: Judgment normal.     No results found for any visits on 09/28/22.      Assessment & Plan:   Problem List Items Addressed This Visit       Other   Chronic pain of left ankle    1. Chronic pain of left ankle - cyclobenzaprine (FLEXERIL) 5 MG tablet; Take 1 tablet (5 mg total) by mouth 3 (three) times daily as needed for muscle spasms.  Dispense: 30 tablet; Refill: 0 - ibuprofen (ADVIL) 600 MG tablet; Take 1 tablet (600 mg total) by mouth every 8 (eight) hours as needed.  Dispense: 30 tablet; Refill: 0 - Ambulatory referral to Orthopedic Surgery - ketorolac (TORADOL) 30 MG/ML injection 30 mg  Use of ankle brace encouraged      Relevant Medications   cyclobenzaprine (FLEXERIL) 5 MG tablet   ibuprofen (ADVIL) 600 MG tablet   Other Relevant Orders   Ambulatory referral to Orthopedic Surgery    Meds ordered this encounter  Medications   cyclobenzaprine (FLEXERIL) 5 MG tablet    Sig: Take 1 tablet (5 mg total) by mouth 3 (three) times daily as needed for muscle spasms.    Dispense:  30 tablet    Refill:  0   ibuprofen (ADVIL) 600 MG tablet    Sig: Take 1 tablet (600 mg total) by mouth every 8 (eight) hours as needed.    Dispense:  30 tablet    Refill:  0   ketorolac (TORADOL) 30 MG/ML injection 30 mg    Return in about 3 months (around 12/29/2022) for CPE.  Donell Beers, FNP

## 2022-09-28 NOTE — Assessment & Plan Note (Signed)
1. Chronic pain of left ankle - cyclobenzaprine (FLEXERIL) 5 MG tablet; Take 1 tablet (5 mg total) by mouth 3 (three) times daily as needed for muscle spasms.  Dispense: 30 tablet; Refill: 0 - ibuprofen (ADVIL) 600 MG tablet; Take 1 tablet (600 mg total) by mouth every 8 (eight) hours as needed.  Dispense: 30 tablet; Refill: 0 - Ambulatory referral to Orthopedic Surgery - ketorolac (TORADOL) 30 MG/ML injection 30 mg  Use of ankle brace encouraged

## 2022-10-13 ENCOUNTER — Other Ambulatory Visit (INDEPENDENT_AMBULATORY_CARE_PROVIDER_SITE_OTHER): Payer: BLUE CROSS/BLUE SHIELD

## 2022-10-13 ENCOUNTER — Ambulatory Visit (INDEPENDENT_AMBULATORY_CARE_PROVIDER_SITE_OTHER): Payer: BLUE CROSS/BLUE SHIELD | Admitting: Orthopedic Surgery

## 2022-10-13 DIAGNOSIS — M19172 Post-traumatic osteoarthritis, left ankle and foot: Secondary | ICD-10-CM

## 2022-10-26 ENCOUNTER — Encounter: Payer: Self-pay | Admitting: Orthopedic Surgery

## 2022-10-26 NOTE — Progress Notes (Signed)
Office Visit Note   Patient: Brandon Grant           Date of Birth: 01-21-1971           MRN: [id number] Visit Date: 10/13/2022              Requested by: Donell Beers, FNP [address],,  [place] [address] PCP: Donell Beers, FNP  Chief Complaint  Patient presents with   Left Ankle - Pain      HPI: Patient is a 52 year old gentleman is seen for initial evaluation for chronic traumatic arthritis left ankle.  Patient has previously had an ankle injection last year.  Patient states he has pain with start up decreased range of motion.  Patient indicates his initial injury was a compound fracture in 2015.  Patient has the worst pain over the medial aspect of the ankle.  Assessment & Plan: Visit Diagnoses:  1. Post-traumatic osteoarthritis, left ankle and foot     Plan: With advanced traumatic arthritis of the left ankle discussed treatment options and patient states he would like to proceed with a anterior fusion.  Will set this up at his convenience.  Risk and benefits were discussed including infection neurovascular injury need for additional surgery.  Patient states he understands wished to proceed at this time.  Follow-Up Instructions: Return in about 2 weeks (around 10/27/2022).   Ortho Exam  Patient is alert, oriented, no adenopathy, well-dressed, normal affect, normal respiratory effort. Examination patient is a good dorsalis pedis pulse there is no varus or valgus malalignment.  He has good subtalar motion has essentially no ankle range of motion.  There is a previous incision over the medial malleolus that extends to the talar neck patient may have had a talar fracture with the open injury.  Imaging: No results found. No images are attached to the encounter.  Labs: No results found for: "HGBA1C", "ESRSEDRATE", "CRP", "LABURIC", "REPTSTATUS", "GRAMSTAIN", "CULT", "LABORGA"   Lab Results  Component Value Date   ALBUMIN 4.4 10/13/2021     No results found for: "MG" No results found for: "VD25OH"  No results found for: "PREALBUMIN"    Latest Ref Rng & Units 12/13/2021    3:04 PM 10/13/2021    3:18 PM  CBC EXTENDED  WBC 3.4 - 10.8 x10E3/uL 7.5  6.8   RBC 4.14 - 5.80 x10E6/uL 4.74  4.40   Hemoglobin 13.0 - 17.7 g/dL 19.1  47.8   HCT 29.5 - 51.0 % 42.2  39.4   Platelets 150 - 450 x10E3/uL 218  235      There is no height or weight on file to calculate BMI.  Orders:  Orders Placed This Encounter  Procedures   XR Ankle Complete Left   No orders of the defined types were placed in this encounter.    Procedures: No procedures performed  Clinical Data: No additional findings.  ROS:  All other systems negative, except as noted in the HPI. Review of Systems  Objective: Vital Signs: There were no vitals taken for this visit.  Specialty Comments:  No specialty comments available.  PMFS History: Patient Active Problem List   Diagnosis Date Noted   Influenza A 02/22/2022   Tobacco abuse 02/22/2022   Tobacco abuse counseling 01/21/2022   Chronic pain of left ankle 10/14/2021   History reviewed. No pertinent past medical history.  Family History  Problem Relation Age of Onset   Cancer Mother    Cancer Father  History reviewed. No pertinent surgical history. Social History   Occupational History   Not on file  Tobacco Use   Smoking status: Every Day   Smokeless tobacco: Not on file  Vaping Use   Vaping status: Never Used  Substance and Sexual Activity   Alcohol use: No   Drug use: No   Sexual activity: Yes

## 2022-11-15 ENCOUNTER — Other Ambulatory Visit: Payer: Self-pay

## 2022-11-15 ENCOUNTER — Encounter (HOSPITAL_COMMUNITY): Payer: Self-pay | Admitting: Orthopedic Surgery

## 2022-11-15 NOTE — Progress Notes (Signed)
Mr. Brandon Grant denies chest pain or shortness of breath. Patient denies having any s/s of Covid in his household, also denies any known exposure to Covid. Mr. Brandon Grant  denies  any s/s of upper or lower respiratory infection in the past 8 weeks.   Mr. Brandon Grant PCP is  Edwin Dada, FNP.   Mr. Brandon Grant lives in the [facility], patient went through the recovery program,  patient has been clean of drugs and alcohol for 1 year, patient is now on staff.  Patient has a roommate, patient is concerned that he may need some help moving around, I told Mr. Brandon Grant to  speak with Dr. Lajoyce Corners in am.

## 2022-11-16 ENCOUNTER — Observation Stay (HOSPITAL_COMMUNITY)
Admission: RE | Admit: 2022-11-16 | Discharge: 2022-11-17 | Disposition: A | Discharge status: Home / Self Care | Payer: BLUE CROSS/BLUE SHIELD | Attending: Orthopedic Surgery | Admitting: Orthopedic Surgery

## 2022-11-16 ENCOUNTER — Ambulatory Visit (HOSPITAL_COMMUNITY): Payer: BLUE CROSS/BLUE SHIELD | Admitting: Certified Registered Nurse Anesthetist

## 2022-11-16 ENCOUNTER — Other Ambulatory Visit: Payer: Self-pay

## 2022-11-16 ENCOUNTER — Encounter (HOSPITAL_COMMUNITY): Payer: Self-pay | Admitting: Orthopedic Surgery

## 2022-11-16 ENCOUNTER — Encounter (HOSPITAL_COMMUNITY)
Admission: RE | Disposition: A | Discharge status: Home / Self Care | Payer: Self-pay | Source: Home / Self Care | Attending: Orthopedic Surgery

## 2022-11-16 DIAGNOSIS — F17210 Nicotine dependence, cigarettes, uncomplicated: Secondary | ICD-10-CM | POA: Insufficient documentation

## 2022-11-16 DIAGNOSIS — M12572 Traumatic arthropathy, left ankle and foot: Secondary | ICD-10-CM | POA: Diagnosis not present

## 2022-11-16 DIAGNOSIS — M19172 Post-traumatic osteoarthritis, left ankle and foot: Principal | ICD-10-CM | POA: Insufficient documentation

## 2022-11-16 DIAGNOSIS — Z981 Arthrodesis status: Principal | ICD-10-CM

## 2022-11-16 HISTORY — DX: Gastro-esophageal reflux disease without esophagitis: K21.9

## 2022-11-16 HISTORY — PX: ANKLE FUSION: SHX881

## 2022-11-16 LAB — CBC
HCT: 43.6 % (ref 39.0–52.0)
Hemoglobin: 15.1 g/dL (ref 13.0–17.0)
MCH: 30.5 pg (ref 26.0–34.0)
MCHC: 34.6 g/dL (ref 30.0–36.0)
MCV: 88.1 fL (ref 80.0–100.0)
Platelets: 197 10*3/uL (ref 150–400)
RBC: 4.95 MIL/uL (ref 4.22–5.81)
RDW: 12 % (ref 11.5–15.5)
WBC: 7.1 10*3/uL (ref 4.0–10.5)
nRBC: 0 % (ref 0.0–0.2)

## 2022-11-16 LAB — SURGICAL PCR SCREEN
MRSA, PCR: NEGATIVE
Staphylococcus aureus: POSITIVE — AB

## 2022-11-16 SURGERY — ARTHRODESIS ANKLE
Anesthesia: General | Site: Ankle | Laterality: Left

## 2022-11-16 MED ORDER — ONDANSETRON HCL 4 MG/2ML IJ SOLN
INTRAMUSCULAR | Status: AC
  Filled 2022-11-16: qty 2

## 2022-11-16 MED ORDER — OXYCODONE HCL 5 MG PO TABS
10.0000 mg | ORAL_TABLET | ORAL | Status: DC | PRN
  Administered 2022-11-16 – 2022-11-17 (×2): 15 mg via ORAL
  Filled 2022-11-16: qty 2
  Filled 2022-11-16 (×2): qty 3

## 2022-11-16 MED ORDER — METHOCARBAMOL 1000 MG/10ML IJ SOLN: 500.0000 mg | Freq: Four times a day (QID) | INTRAVENOUS | Status: DC | PRN

## 2022-11-16 MED ORDER — LACTATED RINGERS IV SOLN: INTRAVENOUS | Status: DC

## 2022-11-16 MED ORDER — HYDROMORPHONE HCL 1 MG/ML IJ SOLN
0.5000 mg | INTRAMUSCULAR | Status: DC | PRN
  Filled 2022-11-16: qty 1

## 2022-11-16 MED ORDER — METOCLOPRAMIDE HCL 5 MG PO TABS: 5.0000 mg | ORAL_TABLET | Freq: Three times a day (TID) | ORAL | Status: DC | PRN

## 2022-11-16 MED ORDER — METOCLOPRAMIDE HCL 5 MG/ML IJ SOLN: 5.0000 mg | Freq: Three times a day (TID) | INTRAMUSCULAR | Status: DC | PRN

## 2022-11-16 MED ORDER — ACETAMINOPHEN 325 MG PO TABS: 325.0000 mg | ORAL_TABLET | Freq: Four times a day (QID) | ORAL | Status: DC | PRN

## 2022-11-16 MED ORDER — BISACODYL 10 MG RE SUPP: 10.0000 mg | Freq: Every day | RECTAL | Status: DC | PRN

## 2022-11-16 MED ORDER — FENTANYL CITRATE (PF) 250 MCG/5ML IJ SOLN
INTRAMUSCULAR | Status: DC | PRN
  Administered 2022-11-16: 25 ug via INTRAVENOUS
  Administered 2022-11-16: 50 ug via INTRAVENOUS
  Administered 2022-11-16: 25 ug via INTRAVENOUS

## 2022-11-16 MED ORDER — DEXAMETHASONE SODIUM PHOSPHATE 10 MG/ML IJ SOLN
INTRAMUSCULAR | Status: AC
  Filled 2022-11-16: qty 1

## 2022-11-16 MED ORDER — ORAL CARE MOUTH RINSE: 15.0000 mL | Freq: Once | OROMUCOSAL | Status: AC

## 2022-11-16 MED ORDER — GLYCOPYRROLATE PF 0.2 MG/ML IJ SOSY
PREFILLED_SYRINGE | INTRAMUSCULAR | Status: AC
  Filled 2022-11-16: qty 1

## 2022-11-16 MED ORDER — METHOCARBAMOL 500 MG PO TABS
500.0000 mg | ORAL_TABLET | Freq: Four times a day (QID) | ORAL | Status: DC | PRN
  Administered 2022-11-17: 500 mg via ORAL
  Filled 2022-11-16: qty 1

## 2022-11-16 MED ORDER — MIDAZOLAM HCL 2 MG/2ML IJ SOLN
INTRAMUSCULAR | Status: DC | PRN
  Administered 2022-11-16: 2 mg via INTRAVENOUS

## 2022-11-16 MED ORDER — SODIUM CHLORIDE 0.9 % IV SOLN: INTRAVENOUS | Status: DC

## 2022-11-16 MED ORDER — OXYCODONE HCL 5 MG PO TABS: 5.0000 mg | ORAL_TABLET | Freq: Once | ORAL | Status: DC | PRN

## 2022-11-16 MED ORDER — PROPOFOL 10 MG/ML IV BOLUS
INTRAVENOUS | Status: AC
  Filled 2022-11-16: qty 20

## 2022-11-16 MED ORDER — FENTANYL CITRATE (PF) 250 MCG/5ML IJ SOLN
INTRAMUSCULAR | Status: AC
  Filled 2022-11-16: qty 5

## 2022-11-16 MED ORDER — ACETAMINOPHEN 10 MG/ML IV SOLN: 1000.0000 mg | Freq: Once | INTRAVENOUS | Status: DC | PRN

## 2022-11-16 MED ORDER — MIDAZOLAM HCL 2 MG/2ML IJ SOLN
INTRAMUSCULAR | Status: AC
  Filled 2022-11-16: qty 2

## 2022-11-16 MED ORDER — MAGNESIUM CITRATE PO SOLN: 1.0000 | Freq: Once | ORAL | Status: DC | PRN

## 2022-11-16 MED ORDER — BUPIVACAINE HCL (PF) 0.5 % IJ SOLN
INTRAMUSCULAR | Status: DC | PRN
Start: 2022-11-16 — End: 2022-11-16
  Administered 2022-11-16: 10 mL
  Administered 2022-11-16: 20 mL

## 2022-11-16 MED ORDER — PANTOPRAZOLE SODIUM 20 MG PO TBEC
20.0000 mg | DELAYED_RELEASE_TABLET | Freq: Two times a day (BID) | ORAL | Status: DC | PRN
  Administered 2022-11-16: 20 mg via ORAL
  Filled 2022-11-16: qty 1

## 2022-11-16 MED ORDER — ONDANSETRON HCL 4 MG/2ML IJ SOLN: 4.0000 mg | Freq: Four times a day (QID) | INTRAMUSCULAR | Status: DC | PRN

## 2022-11-16 MED ORDER — OXYCODONE HCL 5 MG PO TABS
5.0000 mg | ORAL_TABLET | ORAL | Status: DC | PRN
  Administered 2022-11-16: 10 mg via ORAL

## 2022-11-16 MED ORDER — PROPOFOL 10 MG/ML IV BOLUS
INTRAVENOUS | Status: DC | PRN
  Administered 2022-11-16: 200 mg via INTRAVENOUS

## 2022-11-16 MED ORDER — ONDANSETRON HCL 4 MG/2ML IJ SOLN
INTRAMUSCULAR | Status: DC | PRN
  Administered 2022-11-16: 4 mg via INTRAVENOUS

## 2022-11-16 MED ORDER — DEXAMETHASONE SODIUM PHOSPHATE 10 MG/ML IJ SOLN
INTRAMUSCULAR | Status: DC | PRN
  Administered 2022-11-16: 10 mg via INTRAVENOUS

## 2022-11-16 MED ORDER — CEFAZOLIN SODIUM-DEXTROSE 2-4 GM/100ML-% IV SOLN
2.0000 g | Freq: Four times a day (QID) | INTRAVENOUS | Status: AC
  Administered 2022-11-16 – 2022-11-17 (×3): 2 g via INTRAVENOUS
  Filled 2022-11-16 (×2): qty 100

## 2022-11-16 MED ORDER — POLYETHYLENE GLYCOL 3350 17 G PO PACK: 17.0000 g | PACK | Freq: Every day | ORAL | Status: DC | PRN

## 2022-11-16 MED ORDER — DOCUSATE SODIUM 100 MG PO CAPS
100.0000 mg | ORAL_CAPSULE | Freq: Two times a day (BID) | ORAL | Status: DC
  Administered 2022-11-16 – 2022-11-17 (×2): 100 mg via ORAL
  Filled 2022-11-16 (×2): qty 1

## 2022-11-16 MED ORDER — GLYCOPYRROLATE PF 0.2 MG/ML IJ SOSY
PREFILLED_SYRINGE | INTRAMUSCULAR | Status: DC | PRN
Start: 2022-11-16 — End: 2022-11-16
  Administered 2022-11-16: .15 mg via INTRAVENOUS

## 2022-11-16 MED ORDER — CEFAZOLIN SODIUM-DEXTROSE 2-4 GM/100ML-% IV SOLN
INTRAVENOUS | Status: AC
  Filled 2022-11-16: qty 100

## 2022-11-16 MED ORDER — FENTANYL CITRATE (PF) 100 MCG/2ML IJ SOLN: 25.0000 ug | INTRAMUSCULAR | Status: DC | PRN

## 2022-11-16 MED ORDER — ONDANSETRON HCL 4 MG/2ML IJ SOLN: 4.0000 mg | Freq: Once | INTRAMUSCULAR | Status: DC | PRN

## 2022-11-16 MED ORDER — MUPIROCIN 2 % EX OINT
1.0000 | TOPICAL_OINTMENT | Freq: Two times a day (BID) | CUTANEOUS | Status: DC
  Administered 2022-11-16 – 2022-11-17 (×2): 1 via NASAL
  Filled 2022-11-16: qty 22

## 2022-11-16 MED ORDER — CHLORHEXIDINE GLUCONATE CLOTH 2 % EX PADS
6.0000 | MEDICATED_PAD | Freq: Every day | CUTANEOUS | Status: DC
  Administered 2022-11-17: 6 via TOPICAL

## 2022-11-16 MED ORDER — OXYCODONE HCL 5 MG/5ML PO SOLN: 5.0000 mg | Freq: Once | ORAL | Status: DC | PRN

## 2022-11-16 MED ORDER — 0.9 % SODIUM CHLORIDE (POUR BTL) OPTIME
TOPICAL | Status: DC | PRN
Start: 2022-11-16 — End: 2022-11-16
  Administered 2022-11-16: 1000 mL

## 2022-11-16 MED ORDER — CEFAZOLIN SODIUM-DEXTROSE 2-4 GM/100ML-% IV SOLN
2.0000 g | INTRAVENOUS | Status: AC
  Administered 2022-11-16: 2 g via INTRAVENOUS

## 2022-11-16 MED ORDER — ONDANSETRON HCL 4 MG PO TABS: 4.0000 mg | ORAL_TABLET | Freq: Four times a day (QID) | ORAL | Status: DC | PRN

## 2022-11-16 MED ORDER — CHLORHEXIDINE GLUCONATE 0.12 % MT SOLN
15.0000 mL | Freq: Once | OROMUCOSAL | Status: AC
  Administered 2022-11-16: 15 mL via OROMUCOSAL

## 2022-11-16 SURGICAL SUPPLY — 58 items
BAG COUNTER SPONGE SURGICOUNT (BAG) ×1 IMPLANT
BAG SPNG CNTER NS LX DISP (BAG)
BANDAGE ESMARK 6X9 LF (GAUZE/BANDAGES/DRESSINGS) IMPLANT
BIT DRILL SA 2.7 NS (DRILL) IMPLANT
BIT DRILL SA 3.5 NS (DRILL) IMPLANT
BLADE SAW SGTL 83.5X18.5 (BLADE) ×1 IMPLANT
BLADE SURG 10 STRL SS (BLADE) IMPLANT
BLADE SURG 21 STRL SS (BLADE) IMPLANT
BNDG CMPR 5X4 CHSV STRCH STRL (GAUZE/BANDAGES/DRESSINGS) ×1
BNDG CMPR 9X6 STRL LF SNTH (GAUZE/BANDAGES/DRESSINGS)
BNDG COHESIVE 4X5 TAN STRL (GAUZE/BANDAGES/DRESSINGS) ×2 IMPLANT
BNDG COHESIVE 4X5 TAN STRL LF (GAUZE/BANDAGES/DRESSINGS) IMPLANT
BNDG ESMARK 6X9 LF (GAUZE/BANDAGES/DRESSINGS)
BNDG GAUZE DERMACEA FLUFF 4 (GAUZE/BANDAGES/DRESSINGS) ×2 IMPLANT
BNDG GZE DERMACEA 4 6PLY (GAUZE/BANDAGES/DRESSINGS) ×1
COTTON STERILE ROLL (GAUZE/BANDAGES/DRESSINGS) ×1 IMPLANT
COVER MAYO STAND STRL (DRAPES) IMPLANT
COVER SURGICAL LIGHT HANDLE (MISCELLANEOUS) ×2 IMPLANT
DRAPE HALF SHEET 40X57 (DRAPES) ×1 IMPLANT
DRAPE INCISE IOBAN 66X45 STRL (DRAPES) ×1 IMPLANT
DRAPE OEC MINIVIEW 54X84 (DRAPES) ×1 IMPLANT
DRAPE U-SHAPE 47X51 STRL (DRAPES) IMPLANT
DRILL SA 2.7 NS (DRILL) ×1
DRILL SA 3.5 NS (DRILL) ×1
DRIVER CANN SA T20 (ORTHOPEDIC DISPOSABLE SUPPLIES) IMPLANT
DRSG ADAPTIC 3X8 NADH LF (GAUZE/BANDAGES/DRESSINGS) ×1 IMPLANT
DURAPREP 26ML APPLICATOR (WOUND CARE) ×1 IMPLANT
ELECT REM PT RETURN 9FT ADLT (ELECTROSURGICAL) ×1
ELECTRODE REM PT RTRN 9FT ADLT (ELECTROSURGICAL) ×1 IMPLANT
GAUZE PAD ABD 8X10 STRL (GAUZE/BANDAGES/DRESSINGS) IMPLANT
GAUZE SPONGE 4X4 12PLY STRL (GAUZE/BANDAGES/DRESSINGS) ×1 IMPLANT
GLOVE BIOGEL PI IND STRL 9 (GLOVE) ×1 IMPLANT
GLOVE SURG ORTHO 9.0 STRL STRW (GLOVE) ×1 IMPLANT
GOWN STRL REUS W/ TWL XL LVL3 (GOWN DISPOSABLE) ×3 IMPLANT
GOWN STRL REUS W/TWL XL LVL3 (GOWN DISPOSABLE) ×3
KIT BASIN OR (CUSTOM PROCEDURE TRAY) ×1 IMPLANT
KIT TURNOVER KIT B (KITS) ×1 IMPLANT
MANIFOLD NEPTUNE II (INSTRUMENTS) ×1 IMPLANT
NS IRRIG 1000ML POUR BTL (IV SOLUTION) ×1 IMPLANT
PACK ORTHO EXTREMITY (CUSTOM PROCEDURE TRAY) ×1 IMPLANT
PAD ARMBOARD 7.5X6 YLW CONV (MISCELLANEOUS) ×2 IMPLANT
PAD CAST 4YDX4 CTTN HI CHSV (CAST SUPPLIES) ×1 IMPLANT
PADDING CAST COTTON 4X4 STRL (CAST SUPPLIES)
PLATE ANT ANKLE SA STD (Plate) IMPLANT
PUTTY DBM STAGRAFT PLUS 5CC (Putty) IMPLANT
SCREW LOCK SA 5X28 (Screw) IMPLANT
SCREW LOCK SA NS 3.5X42 (Screw) IMPLANT
SCREW LOCK SA STRAT 3.5X32 (Screw) IMPLANT
SCREW NLOCK SA NS 3.5X26 (Screw) IMPLANT
SPONGE T-LAP 18X18 ~~LOC~~+RFID (SPONGE) ×1 IMPLANT
STAPLER VISISTAT 35W (STAPLE) ×1 IMPLANT
SUCTION TUBE FRAZIER 10FR DISP (SUCTIONS) ×1 IMPLANT
SUT ETHILON 2 0 PSLX (SUTURE) ×3 IMPLANT
SUT VIC AB 2-0 CTB1 (SUTURE) ×2 IMPLANT
TOWEL GREEN STERILE (TOWEL DISPOSABLE) ×1 IMPLANT
TOWEL GREEN STERILE FF (TOWEL DISPOSABLE) ×1 IMPLANT
TUBE CONNECTING 12X1/4 (SUCTIONS) ×1 IMPLANT
WATER STERILE IRR 1000ML POUR (IV SOLUTION) ×1 IMPLANT

## 2022-11-16 NOTE — Anesthesia Procedure Notes (Signed)
Anesthesia Regional Block: Popliteal block   Pre-Anesthetic Checklist: , timeout performed,  Correct Patient, Correct Site, Correct Laterality,  Correct Procedure, Correct Position, site marked,  Risks and benefits discussed,  Surgical consent,  Pre-op evaluation,  At surgeon's request and post-op pain management  Laterality: Left  Prep: Maximum Sterile Barrier Precautions used, chloraprep       Needles:  Injection technique: Single-shot  Needle Type: Echogenic Needle      Needle Gauge: 20     Additional Needles:   Procedures:,,,, ultrasound used (permanent image in chart),,    Narrative:  Start time: 11/16/2022 7:50 AM End time: 11/16/2022 7:55 AM Injection made incrementally with aspirations every 5 mL.  Performed by: Personally  Anesthesiologist: Mariann Barter, MD

## 2022-11-16 NOTE — Anesthesia Postprocedure Evaluation (Signed)
Anesthesia Post Note  Patient: Brandon Grant  Procedure(s) Performed: FUSION LEFT ANKLE (Left: Ankle)     Patient location during evaluation: PACU Anesthesia Type: General Level of consciousness: awake and alert Pain management: pain level controlled Vital Signs Assessment: post-procedure vital signs reviewed and stable Respiratory status: spontaneous breathing, nonlabored ventilation, respiratory function stable and patient connected to nasal cannula oxygen Cardiovascular status: blood pressure returned to baseline and stable Postop Assessment: no apparent nausea or vomiting Anesthetic complications: no   No notable events documented.  Last Vitals:  Vitals:   11/16/22 1130 11/16/22 1215  BP: (!) 139/98 111/78  Pulse: (!) 59 (!) 58  Resp: 16 19  Temp:    SpO2: 99% 99%    Last Pain:  Vitals:   11/16/22 1215  TempSrc:   PainSc: Asleep                 Mariann Barter

## 2022-11-16 NOTE — Anesthesia Procedure Notes (Signed)
Anesthesia Regional Block: Adductor canal block   Pre-Anesthetic Checklist: , timeout performed,  Correct Patient, Correct Site, Correct Laterality,  Correct Procedure, Correct Position, site marked,  Risks and benefits discussed,  Surgical consent,  Pre-op evaluation,  At surgeon's request and post-op pain management  Laterality: Left  Prep: Maximum Sterile Barrier Precautions used, chloraprep       Needles:  Injection technique: Single-shot  Needle Type: Echogenic Needle      Needle Gauge: 20     Additional Needles:   Procedures:,,,, ultrasound used (permanent image in chart),,    Narrative:  Start time: 11/16/2022 7:45 AM End time: 11/16/2022 7:50 AM Injection made incrementally with aspirations every 5 mL.  Performed by: Personally  Anesthesiologist: Mariann Barter, MD

## 2022-11-16 NOTE — Progress Notes (Signed)
Orthopedic Tech Progress Note Patient Details:  Brandon Grant 10-02-1970 [id number]  Ortho Devices Type of Ortho Device: CAM walker Ortho Device/Splint Location: LLE Ortho Device/Splint Interventions: Ordered, Application, Adjustment   Post Interventions Patient Tolerated: Well Instructions Provided: Care of device  [name] 11/16/2022, 11:23 AM

## 2022-11-16 NOTE — Op Note (Signed)
11/16/2022  9:49 AM  PATIENT:  Brandon Grant    PRE-OPERATIVE DIAGNOSIS:  Traumatic Arthritis Left Ankle  POST-OPERATIVE DIAGNOSIS:  Same  PROCEDURE:  FUSION LEFT ANKLE C-arm fluoroscopy to verify reduction. Application 5 cc demineralized bone graft putty.  SURGEON:  Nadara Mustard, MD  PHYSICIAN ASSISTANT:None ANESTHESIA:   General  PREOPERATIVE INDICATIONS:  Brandon Grant is a  52 y.o. male with a diagnosis of Traumatic Arthritis Left Ankle who failed conservative measures and elected for surgical management.    The risks benefits and alternatives were discussed with the patient preoperatively including but not limited to the risks of infection, bleeding, nerve injury, cardiopulmonary complications, the need for revision surgery, among others, and the patient was willing to proceed.  OPERATIVE IMPLANTS:   Implant Name Type Inv. Item Serial No. Manufacturer Lot No. LRB No. Used Action  PUTTY DBM STAGRAFT PLUS 5CC - [id number] Putty PUTTY DBM STAGRAFT PLUS 5CC  [employer](ORTH,TRAU,BIO,SG) [id number] Left 1 Implanted  PLATE ANT ANKLE SA STD - [id number] Plate PLATE ANT ANKLE SA STD  [employer](ORTH,TRAU,BIO,SG) [id number] Left 1 Implanted  SCREW NLOCK SA NS 3.5X26 - [id number] Screw SCREW NLOCK SA NS 3.5X26  [employer](ORTH,TRAU,BIO,SG)  Left 2 Implanted  SCREW LOCK SA STRAT 3.5X32 - [id number] Screw SCREW LOCK SA STRAT 3.5X32  [employer](ORTH,TRAU,BIO,SG)  Left 1 Implanted  SCREW LOCK SA NS 3.5X42 - [id number] Screw SCREW LOCK SA NS 3.5X42  [employer](ORTH,TRAU,BIO,SG)  Left 1 Implanted  SCREW NLOCK SA 5X28 - [id number] Screw SCREW NLOCK SA 5X28  [employer](ORTH,TRAU,BIO,SG)  Left 1 Implanted    @ENCIMAGES @  OPERATIVE FINDINGS: See en face be verified reduction and stable internal fixation.  OPERATIVE PROCEDURE: Patient was brought the operating room underwent a general anesthetic.  After adequate levels anesthesia were obtained patient's left lower extremity was  prepped using DuraPrep draped into a sterile field a timeout was called.  A incision was made dorsally over the ankle carried down between the interval of the EHL and anterior tibial tendon.  Blunt dissection was carried down to subperiosteal bone and subperiosteal dissection was used to identify the tibial talar joint.  Retractors were placed to protect the tendons and neurovascular bundle.  A oscillating saw was used to debride the articular cartilage from the tibial talar joint.  This was removed with both an osteotome and a curette.  This was debrided back to bleeding viable subchondral bone with the ankle at neutral dorsiflexion.  The wound was irrigated with normal saline and the tibial talar joint was packed with 5 cc of demineralized bone matrix putty.  The ankle was reduced 2 compression screws were placed in the talus the ankle was compressed and a compression screw was placed to further compress the plate through the oblong hole.  2 additional locking screws were placed in the tibia.  C-arm possibly verified reduction in both AP and lateral planes.  The wound was irrigated with normal saline incision closed using 2-0 nylon a sterile dressing was applied patient was extubated taken the PACU in stable condition.   DISCHARGE PLANNING:  Antibiotic duration: Continue antibiotics for 24 hours  Weightbearing: Nonweightbearing on the left  Pain medication: Opioid pathway  Dressing care/ Wound VAC: Dry dressing reinforce as needed  Ambulatory devices: Crutches  Discharge to: Anticipate discharge to home.  Follow-up: In the office 1 week post operative.

## 2022-11-16 NOTE — Anesthesia Procedure Notes (Signed)
Procedure Name: LMA Insertion Date/Time: 11/16/2022 8:37 AM  Performed by: Shary Decamp, CRNAPre-anesthesia Checklist: Patient identified, Emergency Drugs available, Suction available and Patient being monitored Patient Re-evaluated:Patient Re-evaluated prior to induction Oxygen Delivery Method: Circle system utilized Preoxygenation: Pre-oxygenation with 100% oxygen Induction Type: IV induction Ventilation: Mask ventilation without difficulty LMA: LMA flexible inserted LMA Size: 5.0 Number of attempts: 1 Placement Confirmation: positive ETCO2 and breath sounds checked- equal and bilateral Tube secured with: Tape Dental Injury: Teeth and Oropharynx as per pre-operative assessment

## 2022-11-16 NOTE — Anesthesia Preprocedure Evaluation (Signed)
Anesthesia Evaluation  Patient identified by MRN, date of birth, ID band Patient awake    Reviewed: Allergy & Precautions, NPO status , Patient's Chart, lab work & pertinent test results, reviewed documented beta blocker date and time   History of Anesthesia Complications Negative for: history of anesthetic complications  Airway Mallampati: II  TM Distance: >3 FB Neck ROM: Full    Dental  (+) Edentulous Upper, Edentulous Lower   Pulmonary neg shortness of breath, neg COPD, neg recent URI, Current Smoker   breath sounds clear to auscultation       Cardiovascular (-) hypertension(-) angina (-) CAD, (-) Past MI, (-) Cardiac Stents, (-) CABG, (-) Orthopnea and (-) PND  Rhythm:Regular Rate:Normal     Neuro/Psych neg Seizures    GI/Hepatic ,GERD  ,,(+) neg Cirrhosis        Endo/Other  neg diabetes    Renal/GU Renal disease     Musculoskeletal   Abdominal   Peds  Hematology   Anesthesia Other Findings   Reproductive/Obstetrics                              Anesthesia Physical Anesthesia Plan  ASA: 2  Anesthesia Plan: General   Post-op Pain Management: Regional block*   Induction:   PONV Risk Score and Plan: 1 and Ondansetron and Dexamethasone  Airway Management Planned: Oral ETT  Additional Equipment:   Intra-op Plan:   Post-operative Plan: Extubation in OR  Informed Consent: I have reviewed the patients History and Physical, chart, labs and discussed the procedure including the risks, benefits and alternatives for the proposed anesthesia with the patient or authorized representative who has indicated his/her understanding and acceptance.       Plan Discussed with: CRNA  Anesthesia Plan Comments:          Anesthesia Quick Evaluation

## 2022-11-16 NOTE — Transfer of Care (Signed)
Immediate Anesthesia Transfer of Care Note  Patient: Brandon Grant  Procedure(s) Performed: FUSION LEFT ANKLE (Left: Ankle)  Patient Location: PACU  Anesthesia Type:GA combined with regional for post-op pain  Level of Consciousness: awake, alert , patient cooperative, and responds to stimulation  Airway & Oxygen Therapy: Patient Spontanous Breathing and Patient connected to face mask oxygen  Post-op Assessment: Report given to RN and Post -op Vital signs reviewed and stable  Post vital signs: Reviewed and stable  Last Vitals:  Vitals Value Taken Time  BP 130/81 11/16/22 0947  Temp 36.4 C 11/16/22 0947  Pulse 62 11/16/22 0947  Resp 15 11/16/22 0947  SpO2 100 % 11/16/22 0947    Last Pain:  Vitals:   11/16/22 0947  TempSrc:   PainSc: 0-No pain         Complications: No notable events documented.

## 2022-11-16 NOTE — H&P (Signed)
Roshod Capo is an 52 y.o. male.   Chief Complaint: Traumatic arthritis pain left ankle HPI: Patient is a 52 year old gentleman is seen for initial evaluation for chronic traumatic arthritis left ankle. Patient has previously had an ankle injection last year. Patient states he has pain with start up decreased range of motion. Patient indicates his initial injury was a compound fracture in 2015. Patient has the worst pain over the medial aspect of the ankle.   Past Medical History:  Diagnosis Date   GERD (gastroesophageal reflux disease)     Past Surgical History:  Procedure Laterality Date   FRACTURE SURGERY Left 2014   ankle    Family History  Problem Relation Age of Onset   Cancer Mother    Cancer Father    Social History:  reports that he has been smoking cigarettes. He does not have any smokeless tobacco history on file. He reports that he does not currently use drugs. He reports that he does not drink alcohol.  Allergies: No Known Allergies  Medications Prior to Admission  Medication Sig Dispense Refill   ibuprofen (ADVIL) 600 MG tablet Take 1 tablet (600 mg total) by mouth every 8 (eight) hours as needed. 30 tablet 0   cyclobenzaprine (FLEXERIL) 5 MG tablet Take 1 tablet (5 mg total) by mouth 3 (three) times daily as needed for muscle spasms. (Patient not taking: Reported on 11/07/2022) 30 tablet 0   omeprazole (PRILOSEC) 20 MG capsule Take 1 capsule (20 mg total) by mouth daily. (Patient not taking: Reported on 09/28/2022) 90 capsule 1    No results found for this or any previous visit (from the past 48 hour(s)). No results found.  Review of Systems  All other systems reviewed and are negative.   Height 5\' 11"  (1.803 m), weight 103.4 kg. Physical Exam  Patient is alert, oriented, no adenopathy, well-dressed, normal affect, normal respiratory effort. Examination patient is a good dorsalis pedis pulse there is no varus or valgus malalignment.  He has good subtalar motion  has essentially no ankle range of motion.  There is a previous incision over the medial malleolus that extends to the talar neck patient may have had a talar fracture with the open injury. Assessment/Plan 1. Post-traumatic osteoarthritis, left ankle and foot       Plan: With advanced traumatic arthritis of the left ankle discussed treatment options and patient states he would like to proceed with a anterior fusion.  Will set this up at his convenience.  Risk and benefits were discussed including infection neurovascular injury need for additional surgery.  Patient states he understands wished to proceed at this time.  Nadara Mustard, MD 11/16/2022, 6:57 AM

## 2022-11-17 DIAGNOSIS — M19172 Post-traumatic osteoarthritis, left ankle and foot: Secondary | ICD-10-CM | POA: Diagnosis not present

## 2022-11-17 MED ORDER — OXYCODONE-ACETAMINOPHEN 5-325 MG PO TABS
1.0000 | ORAL_TABLET | ORAL | 0 refills | Status: DC | PRN
Start: 2022-11-17 — End: 2022-11-23

## 2022-11-17 NOTE — Evaluation (Signed)
Occupational Therapy Evaluation Patient Details Name: Brandon Grant MRN: [id number] DOB: [birth year] Today's Date: 11/17/2022   History of Present Illness Pt is 52 yo presenting for scheduled L ankle fusion performed on 11/16/22. Pt is currently NWB on LLE. PMH: GERD   Clinical Impression   Pt is typically independent in ADL And mobility. Pt today is overall supervision/CGA for transfers, supervision for UB and LB ADL. Pt does not need AE to reach LB, able to perform figure 4 and safe ADL from bed or seated level. Educated on use of 3 in 1 as shower chair, toilet riser, and BSC. Pt and s/o verbalized understanding. Educated that Pt should use 3 in 1 as chair in bathroom for grooming, that for safety seated position should be maintained to prevent WB through LLE and for safety. At this time OT education complete Pt and s/o with no questions or concerns and OT will sign off.       If plan is discharge home, recommend the following: Help with stairs or ramp for entrance;A little help with bathing/dressing/bathroom;Assistance with cooking/housework    Functional Status Assessment  Patient has had a recent decline in their functional status and demonstrates the ability to make significant improvements in function in a reasonable and predictable amount of time.  Equipment Recommendations  BSC/3in1    Recommendations for Other Services PT consult     Precautions / Restrictions Precautions Precautions: Fall Restrictions Weight Bearing Restrictions: Yes LLE Weight Bearing: Non weight bearing      Mobility Bed Mobility Overal bed mobility: Modified Independent             General bed mobility comments: HOB elevated, minimal use of rails.    Transfers Overall transfer level: Needs assistance Equipment used: Rolling walker (2 wheels) Transfers: Sit to/from Stand Sit to Stand: Contact guard assist           General transfer comment: for safety      Balance Overall balance  assessment: Needs assistance   Sitting balance-Leahy Scale: Normal     Standing balance support: Bilateral upper extremity supported, Single extremity supported Standing balance-Leahy Scale: Fair Standing balance comment: Requires UE support for balance, CGA for safety                           ADL either performed or assessed with clinical judgement   ADL Overall ADL's : Needs assistance/impaired Eating/Feeding: Independent   Grooming: Modified independent;Sitting Grooming Details (indicate cue type and reason): discussed sitting during grooming tasks - use of 3in1 to maintain NWB Upper Body Bathing: Modified independent;Sitting Upper Body Bathing Details (indicate cue type and reason): educated on 3 in 1 as shower chair Lower Body Bathing: Modified independent;Sitting/lateral leans Lower Body Bathing Details (indicate cue type and reason): can perform figure 4, also educated on how to keep surgical ankle dry during bathing Upper Body Dressing : Independent Upper Body Dressing Details (indicate cue type and reason): donning shirt and undershirt Lower Body Dressing: Supervision/safety Lower Body Dressing Details (indicate cue type and reason): donning underwear, pants, cam boot without assist, maintained NWB throughout Toilet Transfer: Contact guard assist;Ambulation;Rolling walker (2 wheels)   Toileting- Clothing Manipulation and Hygiene: Supervision/safety;Sit to/from stand   Tub/ Shower Transfer: Probation officer Details (indicate cue type and reason): educated on having his roomates move 3 in 1 into shower to use as chair Functional mobility during ADLs: Contact guard assist;Rolling walker (2 wheels) General ADL Comments:  focused on DME education in addition to ADL     Vision Ability to See in Adequate Light: 0 Adequate Patient Visual Report: No change from baseline Vision Assessment?: No apparent visual deficits      Perception Perception: Not tested       Praxis Praxis: Not tested       Pertinent Vitals/Pain Pain Assessment Pain Assessment: Faces Faces Pain Scale: Hurts a little bit Pain Location: L ankle Pain Descriptors / Indicators: Discomfort, Operative site guarding Pain Intervention(s): Limited activity within patient's tolerance, Monitored during session, Repositioned     Extremity/Trunk Assessment Upper Extremity Assessment Upper Extremity Assessment: Overall WFL for tasks assessed   Lower Extremity Assessment Lower Extremity Assessment: Defer to PT evaluation LLE Deficits / Details: L ankle fusion   Cervical / Trunk Assessment Cervical / Trunk Assessment: Normal   Communication Communication Communication: No apparent difficulties Cueing Techniques: Verbal cues;Gestural cues;Visual cues   Cognition Arousal: Alert Behavior During Therapy: WFL for tasks assessed/performed Overall Cognitive Status: Within Functional Limits for tasks assessed                                       General Comments  sigificant other present throughout and recieved all education    Exercises     Shoulder Instructions      Home Living Family/patient expects to be discharged to:: Other (Comment)                                 Additional Comments: [facility]      Prior Functioning/Environment Prior Level of Function : Independent/Modified Independent             Mobility Comments: Pt states he was experiencing difficulty ambulating but did not use an assistive device. ADLs Comments: Pt states that he was independent.        OT Problem List: Impaired balance (sitting and/or standing);Decreased knowledge of use of DME or AE;Pain;Decreased range of motion      OT Treatment/Interventions:      OT Goals(Current goals can be found in the care plan section) Acute Rehab OT Goals Patient Stated Goal: be as independent as possible OT Goal  Formulation: With patient/family Time For Goal Achievement: 12/01/22 Potential to Achieve Goals: Good  OT Frequency:      Co-evaluation              AM-PAC OT "6 Clicks" Daily Activity     Outcome Measure Help from another person eating meals?: None Help from another person taking care of personal grooming?: A Little Help from another person toileting, which includes using toliet, bedpan, or urinal?: A Little Help from another person bathing (including washing, rinsing, drying)?: None Help from another person to put on and taking off regular upper body clothing?: None Help from another person to put on and taking off regular lower body clothing?: A Little 6 Click Score: 21   End of Session Equipment Utilized During Treatment: Rolling walker (2 wheels) (3 in1) Nurse Communication: Mobility status  Activity Tolerance: Patient tolerated treatment well Patient left: in bed;with call bell/phone within reach;with family/visitor present (on EOB)  OT Visit Diagnosis: Other abnormalities of gait and mobility (R26.89);Pain Pain - Right/Left: Left Pain - part of body: Ankle and joints of foot  Time: 1610-9604 OT Time Calculation (min): 25 min Charges:  OT General Charges $OT Visit: 1 Visit OT Evaluation $OT Eval Moderate Complexity: 1 Mod  Nyoka Cowden OTR/L Acute Rehabilitation Services Office: [phone]  Evern Bio [facility] 11/17/2022, 11:09 AM

## 2022-11-17 NOTE — Progress Notes (Signed)
Patient ID: Brandon Grant, male   DOB: September 08, 1970, 52 y.o.   MRN: [id number] Patient is postoperative day 1 tibial talar fusion.  Patient states he is feeling better.  Plan for discharge to home after physical therapy.  He may need home health physical therapy and may need DME.  Discussed that ideally nonweightbearing and wear the fracture boot when ambulating.

## 2022-11-17 NOTE — Progress Notes (Signed)
Discharge instructions given. Patient verbalized understanding and all questions were answered.  ?

## 2022-11-17 NOTE — Discharge Summary (Signed)
Discharge Diagnoses:  Principal Problem:   S/P ankle fusion Active Problems:   Traumatic arthritis of left ankle   Surgeries: Procedure(s): FUSION LEFT ANKLE on 11/16/2022    Consultants:   Discharged Condition: Improved  Hospital Course: Brandon Grant is an 52 y.o. male who was admitted 11/16/2022 with a chief complaint of traumatic arthritis left ankle, with a final diagnosis of Traumatic Arthritis Left Ankle.  Patient was brought to the operating room on 11/16/2022 and underwent Procedure(s): FUSION LEFT ANKLE.    Patient was given perioperative antibiotics:  Anti-infectives (From admission, onward)    Start     Dose/Rate Route Frequency Ordered Stop   11/16/22 1414  ceFAZolin (ANCEF) 2-4 GM/100ML-% IVPB       Note to Pharmacy: Octaviano Glow B: cabinet override      11/16/22 1414 11/17/22 0229   11/16/22 1400  ceFAZolin (ANCEF) IVPB 2g/100 mL premix        2 g 200 mL/hr over 30 Minutes Intravenous Every 6 hours 11/16/22 1354 11/17/22 0507   11/16/22 0700  ceFAZolin (ANCEF) IVPB 2g/100 mL premix        2 g 200 mL/hr over 30 Minutes Intravenous On call to O.R. 11/16/22 4782 11/16/22 0900     .  Patient was given sequential compression devices, early ambulation, and aspirin for DVT prophylaxis.  Recent vital signs: Patient Vitals for the past 24 hrs:  BP Temp Temp src Pulse Resp SpO2  11/17/22 0804 106/64 98 F (36.7 C) -- 81 19 98 %  11/17/22 0446 105/61 98.4 F (36.9 C) -- 76 17 97 %  11/17/22 0029 107/67 98 F (36.7 C) Oral 77 18 98 %  11/16/22 2000 (!) 137/90 98.1 F (36.7 C) Oral 77 19 99 %  11/16/22 1654 (!) 101/57 98.1 F (36.7 C) Oral 75 18 97 %  11/16/22 1515 -- (!) 97.5 F (36.4 C) -- -- -- --  11/16/22 1400 109/79 -- -- 69 14 99 %  11/16/22 1300 108/76 -- -- 63 16 99 %  11/16/22 1215 111/78 -- -- (!) 58 19 99 %  11/16/22 1130 (!) 139/98 -- -- (!) 59 16 99 %  11/16/22 1115 108/78 -- -- (!) 54 17 99 %  11/16/22 1045 112/81 -- -- 76 18 99 %  11/16/22  1030 109/77 -- -- 62 19 100 %  11/16/22 1015 113/75 (!) 97.5 F (36.4 C) -- 67 17 99 %  11/16/22 1000 137/86 -- -- 67 16 99 %  11/16/22 0947 130/81 (!) 97.5 F (36.4 C) -- 62 15 100 %  .  Recent laboratory studies: No results found.  Discharge Medications:   Allergies as of 11/17/2022   No Known Allergies      Medication List     TAKE these medications    cyclobenzaprine 5 MG tablet Commonly known as: FLEXERIL Take 1 tablet (5 mg total) by mouth 3 (three) times daily as needed for muscle spasms.   ibuprofen 600 MG tablet Commonly known as: ADVIL Take 1 tablet (600 mg total) by mouth every 8 (eight) hours as needed.   omeprazole 20 MG capsule Commonly known as: PRILOSEC Take 1 capsule (20 mg total) by mouth daily.   oxyCODONE-acetaminophen 5-325 MG tablet Commonly known as: PERCOCET/ROXICET Take 1 tablet by mouth every 4 (four) hours as needed.               Discharge Care Instructions  (From admission, onward)  Start     Ordered   11/17/22 0000  Non weight bearing       Question Answer Comment  Laterality left   Extremity Lower      11/17/22 0842            Diagnostic Studies: No results found.  Patient benefited maximally from their hospital stay and there were no complications.     Disposition: Discharge disposition: 01-Home or Self Care      Discharge Instructions     Call MD / Call 911   Complete by: As directed    If you experience chest pain or shortness of breath, CALL 911 and be transported to the hospital emergency room.  If you develope a fever above 101 F, pus (white drainage) or increased drainage or redness at the wound, or calf pain, call your surgeon's office.   Constipation Prevention   Complete by: As directed    Drink plenty of fluids.  Prune juice may be helpful.  You may use a stool softener, such as Colace (over the counter) 100 mg twice a day.  Use MiraLax (over the counter) for constipation as needed.    Diet - low sodium heart healthy   Complete by: As directed    Increase activity slowly as tolerated   Complete by: As directed    Non weight bearing   Complete by: As directed    Laterality: left   Extremity: Lower   Post-operative opioid taper instructions:   Complete by: As directed    POST-OPERATIVE OPIOID TAPER INSTRUCTIONS: It is important to wean off of your opioid medication as soon as possible. If you do not need pain medication after your surgery it is ok to stop day one. Opioids include: Codeine, Hydrocodone(Norco, Vicodin), Oxycodone(Percocet, oxycontin) and hydromorphone amongst others.  Long term and even short term use of opiods can cause: Increased pain response Dependence Constipation Depression Respiratory depression And more.  Withdrawal symptoms can include Flu like symptoms Nausea, vomiting And more Techniques to manage these symptoms Hydrate well Eat regular healthy meals Stay active Use relaxation techniques(deep breathing, meditating, yoga) Do Not substitute Alcohol to help with tapering If you have been on opioids for less than two weeks and do not have pain than it is ok to stop all together.  Plan to wean off of opioids This plan should start within one week post op of your joint replacement. Maintain the same interval or time between taking each dose and first decrease the dose.  Cut the total daily intake of opioids by one tablet each day Next start to increase the time between doses. The last dose that should be eliminated is the evening dose.          Follow-up Information     Nadara Mustard, MD Follow up in 1 week(s).   Specialty: Orthopedic Surgery Contact information: [address] [place] [address] [phone]                  Signed: Nadara Mustard 11/17/2022, 8:43 AM

## 2022-11-17 NOTE — Evaluation (Signed)
Physical Therapy Evaluation Patient Details Name: Brandon Grant MRN: [id number] DOB: 05-15-1970 Today's Date: 11/17/2022  History of Present Illness  Pt is 52 yo presenting for scheduled L ankle fusion performed on 11/16/22. Pt is currently NWB on LLE. PMH: GERD  Clinical Impression  Pt is presenting below prior level of function. Currently pt is CGA for sit to stand and gait while maintaining NWB precaution on the LLE. Pt requires intermittent verbal cues to maintain NWB on the LLE especially in standing with distraction. Pt was able to maintain well with sit to stand and gait. Recommend crutches to get around living space and knee scooter for longer distances. Pt will most likely benefit from shower chair on discharge as well in order to decrease risk for falls. Due to pt current functional status, home set up and available assistance at home no recommended skilled physical therapy services at this time on discharge from acute care hospital setting. If pt continues with difficulty following a change in WB status pt and MD can discuss out patient physical therapy.  Pt tolerated treatment session well.       If plan is discharge home, recommend the following: Help with stairs or ramp for entrance;Assistance with cooking/housework     Equipment Recommendations Crutches;Other (comment) (shower chair and knee scooter)     Functional Status Assessment Patient has had a recent decline in their functional status and demonstrates the ability to make significant improvements in function in a reasonable and predictable amount of time.     Precautions / Restrictions Precautions Precautions: Fall Restrictions Weight Bearing Restrictions: Yes LLE Weight Bearing: Non weight bearing      Mobility  Bed Mobility Overal bed mobility: Modified Independent         General bed mobility comments: HOB elevated, minimal use of rails.    Transfers Overall transfer level: Needs assistance Equipment used:  Rolling walker (2 wheels), Crutches Transfers: Sit to/from Stand Sit to Stand: Contact guard assist     General transfer comment: CGA for stability improved with practice. Assist with foot under the L foot in order to maintain WB precautions with good technique.    Ambulation/Gait Ambulation/Gait assistance: Contact guard assist Gait Distance (Feet): 150 Feet (50 ft with RW) Assistive device: Rolling walker (2 wheels), Crutches Gait Pattern/deviations: Step-through pattern, Step-to pattern   Gait velocity interpretation: 1.31 - 2.62 ft/sec, indicative of limited community ambulator   General Gait Details: NWB on the LLE, step through 2 point gait pattern with bil crutches and step to gait pattern with RW. Pt is CGA for stability that slightly improve with distance. Pt is slightly impulsive  Stairs Stairs:  (level entry no stairs at facility)              Balance Overall balance assessment: Needs assistance   Sitting balance-Leahy Scale: Normal     Standing balance support: Bilateral upper extremity supported, Single extremity supported Standing balance-Leahy Scale: Fair Standing balance comment: Requires UE support for balance, CGA for safety         Pertinent Vitals/Pain Pain Assessment Pain Assessment: 0-10 Pain Score: 6  Pain Descriptors / Indicators: Sharp Pain Intervention(s): Monitored during session, RN gave pain meds during session    Home Living Family/patient expects to be discharged to:: Other (Comment)         Additional Comments: 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 Pt states that he may have some assistance but he is unsure what type of assistance or if he  will have any assistance at all.    Prior Function Prior Level of Function : Independent/Modified Independent     Mobility Comments: Pt states he was experiencing difficulty ambulating but did not use an assistive device. ADLs Comments: Pt states that he was independent.      Extremity/Trunk Assessment   Upper Extremity Assessment Upper Extremity Assessment: Defer to OT evaluation    Lower Extremity Assessment Lower Extremity Assessment: Overall WFL for tasks assessed;LLE deficits/detail LLE Deficits / Details: L ankle fusion    Cervical / Trunk Assessment Cervical / Trunk Assessment: Normal  Communication   Communication Communication: No apparent difficulties Cueing Techniques: Verbal cues;Gestural cues;Tactile cues  Cognition Arousal: Alert Behavior During Therapy: WFL for tasks assessed/performed Overall Cognitive Status: Within Functional Limits for tasks assessed      General Comments General comments (skin integrity, edema, etc.): L ankle wrap was clean and dry        Assessment/Plan    PT Assessment Patient needs continued PT services  PT Problem List Decreased mobility;Decreased balance       PT Treatment Interventions DME instruction;Therapeutic exercise;Gait training;Balance training;Neuromuscular re-education;Functional mobility training;Therapeutic activities;Patient/family education    PT Goals (Current goals can be found in the Care Plan section)  Acute Rehab PT Goals Patient Stated Goal: To return home PT Goal Formulation: With patient Time For Goal Achievement: 12/01/22 Potential to Achieve Goals: Good    Frequency Min 1X/week        AM-PAC PT "6 Clicks" Mobility  Outcome Measure Help needed turning from your back to your side while in a flat bed without using bedrails?: None Help needed moving from lying on your back to sitting on the side of a flat bed without using bedrails?: None Help needed moving to and from a bed to a chair (including a wheelchair)?: A Little Help needed standing up from a chair using your arms (e.g., wheelchair or bedside chair)?: A Little Help needed to walk in hospital room?: A Little Help needed climbing 3-5 steps with a railing? : A Lot 6 Click Score: 19    End of Session  Equipment Utilized During Treatment: Gait belt Activity Tolerance: Patient tolerated treatment well Patient left: with call bell/phone within reach;with family/visitor present;in bed Nurse Communication: Mobility status PT Visit Diagnosis: Other abnormalities of gait and mobility (R26.89);Unsteadiness on feet (R26.81)    Time: 1093-2355 PT Time Calculation (min) (ACUTE ONLY): 46 min   Charges:   PT Evaluation $PT Eval Low Complexity: 1 Low PT Treatments $Gait Training: 8-22 mins $Therapeutic Activity: 8-22 mins PT General Charges $$ ACUTE PT VISIT: 1 Visit         Harrel Carina, DPT, CLT  Acute Rehabilitation Services Office: [phone] (Secure chat preferred)   Claudia Desanctis 11/17/2022, 10:28 AM

## 2022-11-18 ENCOUNTER — Other Ambulatory Visit: Payer: Self-pay

## 2022-11-18 ENCOUNTER — Encounter (HOSPITAL_COMMUNITY): Payer: Self-pay | Admitting: Orthopedic Surgery

## 2022-11-18 DIAGNOSIS — G8929 Other chronic pain: Secondary | ICD-10-CM

## 2022-11-18 DIAGNOSIS — K219 Gastro-esophageal reflux disease without esophagitis: Secondary | ICD-10-CM

## 2022-11-18 MED ORDER — OMEPRAZOLE 20 MG PO CPDR
20.0000 mg | DELAYED_RELEASE_CAPSULE | Freq: Every day | ORAL | 1 refills | Status: DC
Start: 2022-11-18 — End: 2023-03-27

## 2022-11-18 MED ORDER — IBUPROFEN 600 MG PO TABS: 600.0000 mg | ORAL_TABLET | Freq: Three times a day (TID) | ORAL | 0 refills | Status: DC | PRN

## 2022-11-18 MED ORDER — CYCLOBENZAPRINE HCL 5 MG PO TABS
5.0000 mg | ORAL_TABLET | Freq: Three times a day (TID) | ORAL | 0 refills | Status: DC | PRN
Start: 2022-11-18 — End: 2023-03-27

## 2022-11-18 NOTE — Transitions of Care (Post Inpatient/ED Visit) (Signed)
   11/18/2022  Name: Brandon Grant MRN: [id number] DOB: Apr 19, 1970  Today's TOC FU Call Status: Today's TOC FU Call Status:: Successful TOC FU Call Completed Patient's Name and Date of Birth confirmed.  Transition Care Management Follow-up Telephone Call Date of Discharge: 11/17/22 Discharge Facility: [facility]) Type of Discharge: Inpatient Admission Primary Inpatient Discharge Diagnosis:: S/p ankle fusion How have you been since you were released from the hospital?: Same Any questions or concerns?: No  Items Reviewed: Did you receive and understand the discharge instructions provided?: Yes Medications obtained,verified, and reconciled?: Yes (Medications Reviewed) Any new allergies since your discharge?: No Dietary orders reviewed?: NA Do you have support at home?: Yes  Medications Reviewed Today: Medications Reviewed Today     Reviewed by Anthoney Harada, LPN (Licensed Practical Nurse) on 11/18/22 at 1449  Med List Status: <None>   Medication Order Taking? Sig Documenting Provider Last Dose Status Informant  cyclobenzaprine (FLEXERIL) 5 MG tablet [id number] No Take 1 tablet (5 mg total) by mouth 3 (three) times daily as needed for muscle spasms.  Patient not taking: Reported on 11/07/2022   Donell Beers, FNP Not Taking Active Self  ibuprofen (ADVIL) 600 MG tablet [id number] No Take 1 tablet (600 mg total) by mouth every 8 (eight) hours as needed.  Patient not taking: Reported on 11/18/2022   Donell Beers, FNP Not Taking Active Self  omeprazole (PRILOSEC) 20 MG capsule [id number] No Take 1 capsule (20 mg total) by mouth daily.  Patient not taking: Reported on 09/28/2022   Donell Beers, FNP Not Taking Active Self  oxyCODONE-acetaminophen (PERCOCET/ROXICET) 5-325 MG tablet [id number] Yes Take 1 tablet by mouth every 4 (four) hours as needed. Nadara Mustard, MD Taking Active             Home Care and Equipment/Supplies: Were Home Health Services Ordered?:  No Any new equipment or medical supplies ordered?: No  Functional Questionnaire: Do you need assistance with bathing/showering or dressing?: No Do you need assistance with meal preparation?: No Do you need assistance with eating?: No Do you have difficulty maintaining continence: No Do you need assistance with getting out of bed/getting out of a chair/moving?: No Do you have difficulty managing or taking your medications?: No  Follow up appointments reviewed: PCP Follow-up appointment confirmed?: NA Specialist Hospital Follow-up appointment confirmed?: Yes Date of Specialist follow-up appointment?: 11/21/22 Follow-Up Specialty Provider:: Dr. Lajoyce Corners Do you need transportation to your follow-up appointment?: No Do you understand care options if your condition(s) worsen?: Yes-patient verbalized understanding  Patient requesting refills of flexeril, ibuprofen, and omeprazole.   SIGNATURE Kandis Fantasia, LPN [facility] Health Advisor [facility] You Are. We Are. [facility] Direct Dial [phone]

## 2022-11-21 ENCOUNTER — Encounter: Payer: Self-pay | Admitting: Orthopedic Surgery

## 2022-11-21 ENCOUNTER — Ambulatory Visit (INDEPENDENT_AMBULATORY_CARE_PROVIDER_SITE_OTHER): Payer: BLUE CROSS/BLUE SHIELD | Admitting: Orthopedic Surgery

## 2022-11-21 DIAGNOSIS — M19172 Post-traumatic osteoarthritis, left ankle and foot: Secondary | ICD-10-CM

## 2022-11-21 NOTE — Progress Notes (Signed)
Office Visit Note   Patient: Brandon Grant           Date of Birth: 1970/03/24           MRN: [id number] Visit Date: 11/21/2022              Requested by: Donell Beers, FNP [phone] [address], [address] [place],  [place] [address] PCP: Donell Beers, FNP  Chief Complaint  Patient presents with   Left Ankle - Routine Post Op    11/16/2022 left ankle fusion       HPI: Patient is a 52 year old gentleman who is 1 week status post left ankle fusion.  Assessment & Plan: Visit Diagnoses:  1. Post-traumatic osteoarthritis, left ankle and foot     Plan: Begin Dial soap cleansing continue nonweightbearing and elevation.  Follow-up in 1 week to harvest the sutures.  Three-view radiographs of the left ankle at follow-up.  Follow-Up Instructions: Return in about 1 week (around 11/28/2022).   Ortho Exam  Patient is alert, oriented, no adenopathy, well-dressed, normal affect, normal respiratory effort. Examination the incision is well-approximated there is minimal swelling.  His foot is plantigrade.  Dry dressing reapplied in the fracture boot reapplied.  Imaging: No results found. No images are attached to the encounter.  Labs: No results found for: "HGBA1C", "ESRSEDRATE", "CRP", "LABURIC", "REPTSTATUS", "GRAMSTAIN", "CULT", "LABORGA"   Lab Results  Component Value Date   ALBUMIN 4.4 10/13/2021    No results found for: "MG" No results found for: "VD25OH"  No results found for: "PREALBUMIN"    Latest Ref Rng & Units 11/16/2022    7:05 AM 12/13/2021    3:04 PM 10/13/2021    3:18 PM  CBC EXTENDED  WBC 4.0 - 10.5 K/uL 7.1  7.5  6.8   RBC 4.22 - 5.81 MIL/uL 4.95  4.74  4.40   Hemoglobin 13.0 - 17.0 g/dL 29.5  28.4  13.2   HCT 39.0 - 52.0 % 43.6  42.2  39.4   Platelets 150 - 400 K/uL 197  218  235      There is no height or weight on file to calculate BMI.  Orders:  No orders of the defined types were placed in this encounter.  No orders of the defined types  were placed in this encounter.    Procedures: No procedures performed  Clinical Data: No additional findings.  ROS:  All other systems negative, except as noted in the HPI. Review of Systems  Objective: Vital Signs: There were no vitals taken for this visit.  Specialty Comments:  No specialty comments available.  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/16/2022   S/P ankle fusion 11/16/2022   Influenza A 02/22/2022   Tobacco abuse 02/22/2022   Tobacco abuse counseling 01/21/2022   Chronic pain of left ankle 10/14/2021   Past Medical History:  Diagnosis Date   GERD (gastroesophageal reflux disease)     Family History  Problem Relation Age of Onset   Cancer Mother    Cancer Father     Past Surgical History:  Procedure Laterality Date   ANKLE FUSION Left 11/16/2022   Procedure: FUSION LEFT ANKLE;  Surgeon: Nadara Mustard, MD;  Location: [facility] OR;  Service: Orthopedics;  Laterality: Left;   FRACTURE SURGERY Left 2014   ankle   Social History   Occupational History   Not on file  Tobacco Use   Smoking  status: Every Day    Current packs/day: 0.60    Types: Cigarettes   Smokeless tobacco: Not on file  Vaping Use   Vaping status: Never Used  Substance and Sexual Activity   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

## 2022-11-22 ENCOUNTER — Telehealth: Payer: Self-pay | Admitting: Nurse Practitioner

## 2022-11-22 NOTE — Telephone Encounter (Signed)
Caller & Relationship to patient:  MRN #  [id number]   Call Back Number:   Date of Last Office Visit: 11/18/2022     Date of Next Office Visit: 02/27/2023    Medication(s) to be Refilled: Oxycodone   Preferred Pharmacy:   ** Please notify patient to allow 48-72 hours to process** **Let patient know to contact pharmacy at the end of the day to make sure medication is ready. ** **If patient has not been seen in a year or longer, book an appointment **Advise to use MyChart for refill requests OR to contact their pharmacy

## 2022-11-23 ENCOUNTER — Telehealth: Payer: Self-pay | Admitting: Orthopedic Surgery

## 2022-11-23 MED ORDER — OXYCODONE-ACETAMINOPHEN 5-325 MG PO TABS: 1.0000 | ORAL_TABLET | ORAL | 0 refills | Status: DC | PRN

## 2022-11-23 NOTE — Addendum Note (Signed)
Addended by: Barnie Del R on: 11/23/2022 10:37 AM   Modules accepted: Orders

## 2022-11-23 NOTE — Telephone Encounter (Signed)
Patient called for a refill for pain medication. CB#[phone]

## 2022-11-23 NOTE — Telephone Encounter (Signed)
This pt is s/p a left ankle fusion on 11/16/2022 last refill of Oxycodone 5/325 was 11/17/2022 #30 please advise.

## 2022-11-29 ENCOUNTER — Encounter: Payer: Self-pay | Admitting: Family

## 2022-11-29 ENCOUNTER — Other Ambulatory Visit (INDEPENDENT_AMBULATORY_CARE_PROVIDER_SITE_OTHER): Payer: BLUE CROSS/BLUE SHIELD

## 2022-11-29 ENCOUNTER — Ambulatory Visit (INDEPENDENT_AMBULATORY_CARE_PROVIDER_SITE_OTHER): Payer: BLUE CROSS/BLUE SHIELD | Admitting: Family

## 2022-11-29 DIAGNOSIS — M19172 Post-traumatic osteoarthritis, left ankle and foot: Secondary | ICD-10-CM | POA: Diagnosis not present

## 2022-11-29 MED ORDER — DOXYCYCLINE HYCLATE 100 MG PO TABS
100.0000 mg | ORAL_TABLET | Freq: Two times a day (BID) | ORAL | 0 refills | Status: AC
Start: 2022-11-29 — End: ?

## 2022-11-29 MED ORDER — OXYCODONE-ACETAMINOPHEN 5-325 MG PO TABS: 1.0000 | ORAL_TABLET | Freq: Four times a day (QID) | ORAL | 0 refills | Status: DC | PRN

## 2022-11-29 NOTE — Progress Notes (Signed)
Post-Op Visit Note   Patient: Brandon Grant           Date of Birth: 1970-07-27           MRN: [id number] Visit Date: 11/29/2022 PCP: Donell Beers, FNP  Chief Complaint:  Chief Complaint  Patient presents with   Left Ankle - Routine Post Op    11/16/2022 left ankle fusion    HPI:  HPI the patient is a 52 year old gentleman seen status post left ankle fusion September 4.  He has had a dry dressing in place since his last visit with [place] Ortho Exam On examination of the left ankle the anterior incision is well-approximated with sutures there is no gaping he did have drainage of hematoma from his incision this was dark blood there is no purulence no odor no surrounding erythema no maceration no sign of infection Visit Diagnoses:  1. Post-traumatic osteoarthritis, left ankle and foot     Plan: = Will place on a course of doxycycline out of caution and will recommend daily dose of cleansing dry dressings elevation continue nonweightbearing.  Repeat radiographs at follow-up in 2 weeks  Follow-Up Instructions: No follow-ups on file.   Imaging: No results found.  Orders:  Orders Placed This Encounter  Procedures   XR Ankle Complete Left   No orders of the defined types were placed in this encounter.    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/16/2022   S/P ankle fusion 11/16/2022   Influenza A 02/22/2022   Tobacco abuse 02/22/2022   Tobacco abuse counseling 01/21/2022   Chronic pain of left ankle 10/14/2021   Past Medical History:  Diagnosis Date   GERD (gastroesophageal reflux disease)     Family History  Problem Relation Age of Onset   Cancer Mother    Cancer Father     Past Surgical History:  Procedure Laterality Date   ANKLE FUSION Left 11/16/2022   Procedure: FUSION LEFT ANKLE;  Surgeon: Nadara Mustard, MD;  Location: [facility] OR;  Service: Orthopedics;  Laterality: Left;   FRACTURE SURGERY Left  2014   ankle   Social History   Occupational History   Not on file  Tobacco Use   Smoking status: Every Day    Current packs/day: 0.60    Types: Cigarettes   Smokeless tobacco: Not on file  Vaping Use   Vaping status: Never Used  Substance and Sexual Activity   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

## 2022-12-13 ENCOUNTER — Ambulatory Visit (INDEPENDENT_AMBULATORY_CARE_PROVIDER_SITE_OTHER): Payer: BLUE CROSS/BLUE SHIELD | Admitting: Family

## 2022-12-13 ENCOUNTER — Encounter: Payer: Self-pay | Admitting: Family

## 2022-12-13 ENCOUNTER — Other Ambulatory Visit: Payer: Self-pay

## 2022-12-13 DIAGNOSIS — M25572 Pain in left ankle and joints of left foot: Secondary | ICD-10-CM

## 2022-12-13 MED ORDER — DOXYCYCLINE HYCLATE 100 MG PO TABS: 100.0000 mg | ORAL_TABLET | Freq: Two times a day (BID) | ORAL | 0 refills | Status: DC

## 2022-12-13 MED ORDER — MUPIROCIN 2 % EX OINT: 1.0000 | TOPICAL_OINTMENT | Freq: Every day | CUTANEOUS | 0 refills | Status: DC

## 2022-12-13 MED ORDER — OXYCODONE-ACETAMINOPHEN 5-325 MG PO TABS: 1.0000 | ORAL_TABLET | Freq: Three times a day (TID) | ORAL | 0 refills | Status: DC | PRN

## 2022-12-13 NOTE — Progress Notes (Signed)
Office Visit Note   Patient: Brandon Grant           Date of Birth: [birth year]           MRN: [id number] Visit Date: 12/13/2022              Requested by: Donell Beers, FNP [phone] [address] [place],  [place] [address] PCP: Donell Beers, FNP  Chief Complaint  Patient presents with   Left Ankle - Routine Post Op    11/16/2022 left ankle fusion      HPI: The patient is a 52 year old gentleman who is seen status post fusion left ankle September 4.  He has been using crutches and touchdown weightbearing he denies pain he has not changed his dressing since last visit has been on doxycycline for the last 2 weeks  Overall feels well denies fevers or chills  Assessment & Plan: Visit Diagnoses:  1. Pain in left ankle and joints of left foot     Plan: Will continue his course of doxycycline.  Encouraged him to begin daily dose of cleansing dry dressings about the ankle he may continue touchdown weightbearing on the left advised if he has pain with weightbearing to resume nonweightbearing  Follow-Up Instructions: No follow-ups on file.   Ortho Exam  Patient is alert, oriented, no adenopathy, well-dressed, normal affect, normal respiratory effort. On examination of the left ankle the anterior incision is healing well there is 1 pinpoint open area with serosanguineous drainage there is no surrounding erythema or odor  Imaging: No results found. No images are attached to the encounter.  Labs: No results found for: "HGBA1C", "ESRSEDRATE", "CRP", "LABURIC", "REPTSTATUS", "GRAMSTAIN", "CULT", "LABORGA"   Lab Results  Component Value Date   ALBUMIN 4.4 10/13/2021    No results found for: "MG" No results found for: "VD25OH"  No results found for: "PREALBUMIN"    Latest Ref Rng & Units 11/16/2022    7:05 AM 12/13/2021    3:04 PM 10/13/2021    3:18 PM  CBC EXTENDED  WBC 4.0 - 10.5 K/uL 7.1  7.5  6.8   RBC 4.22 - 5.81 MIL/uL 4.95  4.74  4.40   Hemoglobin 13.0  - 17.0 g/dL 47.8  29.5  62.1   HCT 39.0 - 52.0 % 43.6  42.2  39.4   Platelets 150 - 400 K/uL 197  218  235      There is no height or weight on file to calculate BMI.  Orders:  Orders Placed This Encounter  Procedures   XR Ankle Complete Left   No orders of the defined types were placed in this encounter.    Procedures: No procedures performed  Clinical Data: No additional findings.  ROS:  All other systems negative, except as noted in the HPI. Review of Systems  Objective: Vital Signs: There were no vitals taken for this visit.  Specialty Comments:  No specialty comments available.  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/16/2022   S/P ankle fusion 11/16/2022   Influenza A 02/22/2022   Tobacco abuse 02/22/2022   Tobacco abuse counseling 01/21/2022   Chronic pain of left ankle 10/14/2021   Past Medical History:  Diagnosis Date   GERD (gastroesophageal reflux disease)     Family History  Problem Relation Age of Onset   Cancer Mother    Cancer Father     Past Surgical History:  Procedure  Laterality Date   ANKLE FUSION Left 11/16/2022   Procedure: FUSION LEFT ANKLE;  Surgeon: Nadara Mustard, MD;  Location: [facility] OR;  Service: Orthopedics;  Laterality: Left;   FRACTURE SURGERY Left 2014   ankle   Social History   Occupational History   Not on file  Tobacco Use   Smoking status: Every Day    Current packs/day: 0.60    Types: Cigarettes   Smokeless tobacco: Not on file  Vaping Use   Vaping status: Never Used  Substance and Sexual Activity   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

## 2022-12-27 ENCOUNTER — Encounter: Payer: Self-pay | Admitting: Family

## 2022-12-27 ENCOUNTER — Ambulatory Visit (INDEPENDENT_AMBULATORY_CARE_PROVIDER_SITE_OTHER): Payer: BLUE CROSS/BLUE SHIELD | Admitting: Family

## 2022-12-27 DIAGNOSIS — Z981 Arthrodesis status: Secondary | ICD-10-CM

## 2022-12-27 NOTE — Progress Notes (Signed)
   Post-Op Visit Note   Patient: Brandon Grant           Date of Birth: [birth year]           MRN: [id number] Visit Date: 12/27/2022 PCP: Donell Beers, FNP  Chief Complaint:  Chief Complaint  Patient presents with   Left Ankle - Routine Post Op    11/16/2022 left ankle fusion    HPI:  HPI The patient is a 52 year old gentleman who is seen status post fusion left ankle September 4 of this year.  He has been using crutches touchdown weightbearing in his cam walker.  Feels well Ortho Exam On examination of the left ankle the incision is well-healed there is minimal edema there is no erythema or warmth.  Range of motion fixed  Visit Diagnoses: No diagnosis found.  Plan: May full weightbearing in his cam walker he will follow-up in 2 weeks with repeat radiographs anticipate releasing him at that time  Follow-Up Instructions: Return in about 16 days (around 01/12/2023).   Imaging: No results found.  Orders:  No orders of the defined types were placed in this encounter.  No orders of the defined types were placed in this encounter.    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/16/2022   S/P ankle fusion 11/16/2022   Influenza A 02/22/2022   Tobacco abuse 02/22/2022   Tobacco abuse counseling 01/21/2022   Chronic pain of left ankle 10/14/2021   Past Medical History:  Diagnosis Date   GERD (gastroesophageal reflux disease)     Family History  Problem Relation Age of Onset   Cancer Mother    Cancer Father     Past Surgical History:  Procedure Laterality Date   ANKLE FUSION Left 11/16/2022   Procedure: FUSION LEFT ANKLE;  Surgeon: Nadara Mustard, MD;  Location: 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

## 2022-12-28 ENCOUNTER — Ambulatory Visit: Payer: Self-pay | Admitting: Nurse Practitioner

## 2023-01-13 ENCOUNTER — Encounter: Payer: Self-pay | Admitting: Family

## 2023-01-13 ENCOUNTER — Other Ambulatory Visit (INDEPENDENT_AMBULATORY_CARE_PROVIDER_SITE_OTHER): Payer: BLUE CROSS/BLUE SHIELD

## 2023-01-13 ENCOUNTER — Ambulatory Visit (INDEPENDENT_AMBULATORY_CARE_PROVIDER_SITE_OTHER): Payer: BLUE CROSS/BLUE SHIELD | Admitting: Family

## 2023-01-13 DIAGNOSIS — Z981 Arthrodesis status: Secondary | ICD-10-CM | POA: Diagnosis not present

## 2023-01-13 MED ORDER — OXYCODONE-ACETAMINOPHEN 5-325 MG PO TABS: 1.0000 | ORAL_TABLET | Freq: Three times a day (TID) | ORAL | 0 refills | Status: DC | PRN

## 2023-01-13 NOTE — Progress Notes (Signed)
   Post-Op Visit Note   Patient: Brandon Grant           Date of Birth: 21-Aug-1970           MRN: [id number] Visit Date: 01/13/2023 PCP: Donell Beers, FNP  Chief Complaint:  Chief Complaint  Patient presents with   Left Ankle - Routine Post Op    11/16/2022 left ankle fusion    HPI:  HPI The patient is a 52 year old gentleman who is status post left ankle fusion has been full weightbearing in a cam boot complaining of some pain especially by the end of the day throbbing.  Things are not quite stable has attempted weightbearing out of the boot Ortho Exam On examination left ankle the incisions well-healed there is no edema no erythema or warmth  Visit Diagnoses:  1. S/P ankle fusion     Plan: Recommended he resume nonweightbearing.  Given a new pair of crutches.  Follow-up with Dr. Lajoyce Corners  Follow-Up Instructions: Return in about 17 days (around 01/30/2023).   Imaging: No results found.  Orders:  Orders Placed This Encounter  Procedures   XR Ankle Complete Left   No orders of the defined types were placed in this encounter.    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/16/2022   S/P ankle fusion 11/16/2022   Influenza A 02/22/2022   Tobacco abuse 02/22/2022   Tobacco abuse counseling 01/21/2022   Chronic pain of left ankle 10/14/2021   Past Medical History:  Diagnosis Date   GERD (gastroesophageal reflux disease)     Family History  Problem Relation Age of Onset   Cancer Mother    Cancer Father     Past Surgical History:  Procedure Laterality Date   ANKLE FUSION Left 11/16/2022   Procedure: FUSION LEFT ANKLE;  Surgeon: Nadara Mustard, MD;  Location: [facility] OR;  Service: Orthopedics;  Laterality: Left;   FRACTURE SURGERY Left 2014   ankle   Social History   Occupational History   Not on file  Tobacco Use   Smoking status: Every Day    Current packs/day: 0.60    Types: Cigarettes    Smokeless tobacco: Not on file  Vaping Use   Vaping status: Never Used  Substance and Sexual Activity   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

## 2023-01-19 ENCOUNTER — Telehealth: Payer: Self-pay | Admitting: Family

## 2023-01-19 NOTE — Telephone Encounter (Signed)
SW pt, he just wanted to make an appt for a follow up and do discuss his options of when he will be healed. He said the boot and crutches are making him depressed. I have scheduled him next Thursday 01/26/23 to follow up with Dr duda.

## 2023-01-19 NOTE — Telephone Encounter (Signed)
Pt called in stating he was told he was supposed to be getting a call from someone about his boot per his last visit 11/1 and he was also supposed to follow up with Lajoyce Corners around 11/18 pt has not received a call per the notes I dont see any information to give him and he is unsure if he is supposed to see Lajoyce Corners before or after he speaks with said person about his boot, please advise

## 2023-01-26 ENCOUNTER — Encounter: Payer: BLUE CROSS/BLUE SHIELD | Admitting: Orthopedic Surgery

## 2023-01-27 ENCOUNTER — Ambulatory Visit (INDEPENDENT_AMBULATORY_CARE_PROVIDER_SITE_OTHER): Payer: BLUE CROSS/BLUE SHIELD | Admitting: Orthopedic Surgery

## 2023-01-27 DIAGNOSIS — Z981 Arthrodesis status: Secondary | ICD-10-CM

## 2023-02-02 ENCOUNTER — Encounter: Payer: Self-pay | Admitting: Orthopedic Surgery

## 2023-02-02 NOTE — Progress Notes (Signed)
Office Visit Note   Patient: Brandon Grant           Date of Birth: 03/29/1970           MRN: [id number] Visit Date: 01/27/2023              Requested by: Donell Beers, FNP [phone] [address] [place],  [place] [address] PCP: Donell Beers, FNP  Chief Complaint  Patient presents with   Left Ankle - Routine Post Op    11/16/2022 left ankle fusion      HPI: Patient is a 52 year old gentleman is 2 months status post left ankle fusion.  He is nonweightbearing on crutches and a fracture boot.  Patient states he feels well.  Assessment & Plan: Visit Diagnoses:  1. S/P ankle fusion     Plan: Patient will begin increasing his activities as tolerated.  Begin weightbearing as tolerated in the fracture boot and wean off the crutches.  Repeat follow-up in 4 weeks with three-view radiographs of the left ankle at which time anticipate he could be placed in an ASO.  Follow-Up Instructions: Return in about 4 weeks (around 02/24/2023).   Ortho Exam  Patient is alert, oriented, no adenopathy, well-dressed, normal affect, normal respiratory effort. Ankle is at 90 degrees.  There is no redness cellulitis or drainage.  Imaging: No results found. No images are attached to the encounter.  Labs: No results found for: "HGBA1C", "ESRSEDRATE", "CRP", "LABURIC", "REPTSTATUS", "GRAMSTAIN", "CULT", "LABORGA"   Lab Results  Component Value Date   ALBUMIN 4.4 10/13/2021    No results found for: "MG" No results found for: "VD25OH"  No results found for: "PREALBUMIN"    Latest Ref Rng & Units 11/16/2022    7:05 AM 12/13/2021    3:04 PM 10/13/2021    3:18 PM  CBC EXTENDED  WBC 4.0 - 10.5 K/uL 7.1  7.5  6.8   RBC 4.22 - 5.81 MIL/uL 4.95  4.74  4.40   Hemoglobin 13.0 - 17.0 g/dL 56.2  13.0  86.5   HCT 39.0 - 52.0 % 43.6  42.2  39.4   Platelets 150 - 400 K/uL 197  218  235      There is no height or weight on file to calculate BMI.  Orders:  No orders of the defined  types were placed in this encounter.  No orders of the defined types were placed in this encounter.    Procedures: No procedures performed  Clinical Data: No additional findings.  ROS:  All other systems negative, except as noted in the HPI. Review of Systems  Objective: Vital Signs: There were no vitals taken for this visit.  Specialty Comments:  No specialty comments available.  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/16/2022   S/P ankle fusion 11/16/2022   Influenza A 02/22/2022   Tobacco abuse 02/22/2022   Tobacco abuse counseling 01/21/2022   Chronic pain of left ankle 10/14/2021   Past Medical History:  Diagnosis Date   GERD (gastroesophageal reflux disease)     Family History  Problem Relation Age of Onset   Cancer Mother    Cancer Father     Past Surgical History:  Procedure Laterality Date   ANKLE FUSION Left 11/16/2022   Procedure: FUSION LEFT ANKLE;  Surgeon: Nadara Mustard, MD;  Location: [facility] OR;  Service: Orthopedics;  Laterality: Left;   FRACTURE SURGERY Left 2014  ankle   Social History   Occupational History   Not on file  Tobacco Use   Smoking status: Every Day    Current packs/day: 0.60    Types: Cigarettes   Smokeless tobacco: Not on file  Vaping Use   Vaping status: Never Used  Substance and Sexual Activity   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

## 2023-02-27 ENCOUNTER — Ambulatory Visit: Payer: Self-pay | Admitting: Nurse Practitioner

## 2023-02-28 ENCOUNTER — Other Ambulatory Visit (INDEPENDENT_AMBULATORY_CARE_PROVIDER_SITE_OTHER): Payer: BLUE CROSS/BLUE SHIELD

## 2023-02-28 ENCOUNTER — Ambulatory Visit (INDEPENDENT_AMBULATORY_CARE_PROVIDER_SITE_OTHER): Payer: BLUE CROSS/BLUE SHIELD | Admitting: Orthopedic Surgery

## 2023-02-28 ENCOUNTER — Encounter: Payer: Self-pay | Admitting: Orthopedic Surgery

## 2023-02-28 DIAGNOSIS — Z981 Arthrodesis status: Secondary | ICD-10-CM

## 2023-02-28 NOTE — Progress Notes (Signed)
Office Visit Note   Patient: Brandon Grant           Date of Birth: April 01, 1970           MRN: [id number] Visit Date: 02/28/2023              Requested by: Donell Beers, FNP [phone] [address] [place],  [place] [address] PCP: Donell Beers, FNP  Chief Complaint  Patient presents with   Left Ankle - Follow-up    11/16/2022 left ankle fusion      HPI: Patient is a 52 year old gentleman who is 3 months status post left ankle fusion.  Currently weightbearing in a fracture boot.  Patient states he does have start up stiffness.  Assessment & Plan: Visit Diagnoses:  1. S/P ankle fusion     Plan: Advance to regular shoewear we will give him an ASO to further support the ankle.  Discussed with the patient he will not be able to continue his previous level of work with the fused ankle.  Recommended that he apply for disability.  Follow-Up Instructions: No follow-ups on file.   Ortho Exam  Patient is alert, oriented, no adenopathy, well-dressed, normal affect, normal respiratory effort. Examination patient's foot is plantigrade there is no redness or cellulitis.  An ASO was applied.  Imaging: XR Ankle Complete Left Result Date: 02/28/2023 Three-view radiographs of the left foot shows a plantigrade foot with stable tibial talar fusion.  No hardware failure.  No images are attached to the encounter.  Labs: No results found for: "HGBA1C", "ESRSEDRATE", "CRP", "LABURIC", "REPTSTATUS", "GRAMSTAIN", "CULT", "LABORGA"   Lab Results  Component Value Date   ALBUMIN 4.4 10/13/2021    No results found for: "MG" No results found for: "VD25OH"  No results found for: "PREALBUMIN"    Latest Ref Rng & Units 11/16/2022    7:05 AM 12/13/2021    3:04 PM 10/13/2021    3:18 PM  CBC EXTENDED  WBC 4.0 - 10.5 K/uL 7.1  7.5  6.8   RBC 4.22 - 5.81 MIL/uL 4.95  4.74  4.40   Hemoglobin 13.0 - 17.0 g/dL 96.2  95.2  84.1   HCT 39.0 - 52.0 % 43.6  42.2  39.4   Platelets 150 -  400 K/uL 197  218  235      There is no height or weight on file to calculate BMI.  Orders:  Orders Placed This Encounter  Procedures   XR Ankle Complete Left   No orders of the defined types were placed in this encounter.    Procedures: No procedures performed  Clinical Data: No additional findings.  ROS:  All other systems negative, except as noted in the HPI. Review of Systems  Objective: Vital Signs: There were no vitals taken for this visit.  Specialty Comments:  No specialty comments available.  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/16/2022   S/P ankle fusion 11/16/2022   Influenza A 02/22/2022   Tobacco abuse 02/22/2022   Tobacco abuse counseling 01/21/2022   Chronic pain of left ankle 10/14/2021   Past Medical History:  Diagnosis Date   GERD (gastroesophageal reflux disease)     Family History  Problem Relation Age of Onset   Cancer Mother    Cancer Father     Past Surgical History:  Procedure Laterality Date   ANKLE FUSION Left 11/16/2022   Procedure: FUSION LEFT ANKLE;  Surgeon: Nadara Mustard, MD;  Location: [facility] OR;  Service: Orthopedics;  Laterality: Left;   FRACTURE SURGERY Left 2014   ankle   Social History   Occupational History   Not on file  Tobacco Use   Smoking status: Every Day    Current packs/day: 0.60    Types: Cigarettes   Smokeless tobacco: Not on file  Vaping Use   Vaping status: Never Used  Substance and Sexual Activity   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

## 2023-03-06 ENCOUNTER — Ambulatory Visit (INDEPENDENT_AMBULATORY_CARE_PROVIDER_SITE_OTHER): Payer: BLUE CROSS/BLUE SHIELD | Admitting: Nurse Practitioner

## 2023-03-06 ENCOUNTER — Encounter: Payer: Self-pay | Admitting: Nurse Practitioner

## 2023-03-06 VITALS — BP 104/56 | HR 80 | Temp 97.3°F | Wt 231.6 lb

## 2023-03-06 DIAGNOSIS — Z720 Tobacco use: Secondary | ICD-10-CM

## 2023-03-06 DIAGNOSIS — Z1211 Encounter for screening for malignant neoplasm of colon: Secondary | ICD-10-CM

## 2023-03-06 DIAGNOSIS — M25572 Pain in left ankle and joints of left foot: Secondary | ICD-10-CM

## 2023-03-06 DIAGNOSIS — Z130 Encounter for screening for diseases of the blood and blood-forming organs and certain disorders involving the immune mechanism: Secondary | ICD-10-CM

## 2023-03-06 DIAGNOSIS — Z1329 Encounter for screening for other suspected endocrine disorder: Secondary | ICD-10-CM

## 2023-03-06 DIAGNOSIS — E669 Obesity, unspecified: Secondary | ICD-10-CM | POA: Insufficient documentation

## 2023-03-06 DIAGNOSIS — G8929 Other chronic pain: Secondary | ICD-10-CM

## 2023-03-06 DIAGNOSIS — Z13228 Encounter for screening for other metabolic disorders: Secondary | ICD-10-CM

## 2023-03-06 DIAGNOSIS — Z0000 Encounter for general adult medical examination without abnormal findings: Secondary | ICD-10-CM | POA: Insufficient documentation

## 2023-03-06 DIAGNOSIS — Z1321 Encounter for screening for nutritional disorder: Secondary | ICD-10-CM

## 2023-03-06 DIAGNOSIS — L608 Other nail disorders: Secondary | ICD-10-CM | POA: Insufficient documentation

## 2023-03-06 DIAGNOSIS — R269 Unspecified abnormalities of gait and mobility: Secondary | ICD-10-CM | POA: Insufficient documentation

## 2023-03-06 MED ORDER — IBUPROFEN 600 MG PO TABS: 600.0000 mg | ORAL_TABLET | Freq: Three times a day (TID) | ORAL | 1 refills | Status: DC | PRN

## 2023-03-06 NOTE — Assessment & Plan Note (Signed)
Smokes about 0.5pack/day  Asked about quitting: confirms that he/she currently smokes cigarettes Advise to quit smoking: Educated about QUITTING to reduce the risk of cancer, cardio and cerebrovascular disease. Assess willingness: Unwilling to quit at this time, but is working on cutting back. Assist with counseling and pharmacotherapy: Counseled for 5 minutes and literature provided. Arrange for follow up: follow up in 1-2 months and continue to offer help.

## 2023-03-06 NOTE — Assessment & Plan Note (Signed)
Needs help with nail cut Patient referred to podiatry

## 2023-03-06 NOTE — Patient Instructions (Addendum)
Please consider getting Shingrix and influenza vaccine at local pharmacy.   1. Screening for endocrine, nutritional, metabolic and immunity disorder (Primary)  - HIV Antibody (routine testing w rflx) - Hepatitis C antibody - CBC - CMP14+EGFR - PSA - Lipid panel; Future  2. Screening for colon cancer  - Cologuard  3. Chronic pain of left ankle  - ibuprofen (ADVIL) 600 MG tablet; Take 1 tablet (600 mg total) by mouth every 8 (eight) hours as needed.  Dispense: 30 tablet; Refill: 1    It is important that you exercise regularly at least 30 minutes 5 times a week as tolerated  Think about what you will eat, plan ahead. Choose " clean, green, fresh or frozen" over canned, processed or packaged foods which are more sugary, salty and fatty. 70 to 75% of food eaten should be vegetables and fruit. Three meals at set times with snacks allowed between meals, but they must be fruit or vegetables. Aim to eat over a 12 hour period , example 7 am to 7 pm, and STOP after  your last meal of the day. Drink water,generally about 64 ounces per day, no other drink is as healthy. Fruit juice is best enjoyed in a healthy way, by EATING the fruit.  Thanks for choosing [facility] we consider it a privelige to serve you.

## 2023-03-06 NOTE — Assessment & Plan Note (Signed)
Patient counseled on low-carb diet Encouraged to engage in regular moderate exercise as tolerated Wt Readings from Last 3 Encounters:  03/06/23 231 lb 9.6 oz (105.1 kg)  11/16/22 228 lb (103.4 kg)  09/28/22 226 lb 9.6 oz (102.8 kg)   Body mass index is 32.3 kg/m.

## 2023-03-06 NOTE — Assessment & Plan Note (Addendum)
Status post ankle fusion surgery Ibuprofen 600 mg 3 times daily as needed refilled patient encouraged to take with food Follow-up with orthopedics as planned

## 2023-03-06 NOTE — Assessment & Plan Note (Signed)
Due to chronic left ankle pain Ibuprofen 600 mg 3 times daily as needed refilled Encouraged to maintain close follow-up with orthopedics

## 2023-03-06 NOTE — Progress Notes (Signed)
Complete physical exam  Patient: Brandon Grant   DOB: 1970/12/11   52 y.o. Male  MRN: [id number]  Subjective:    Chief Complaint  Patient presents with   Annual Exam    Brandon Grant is a 52 y.o. male who presents today for a complete physical exam. He reports consuming a general diet. The patient does not participate in regular exercise at present. He generally feels well. He reports sleeping well.   Tobacco use disorder smokes half pack of cigarettes daily, patient denies shortness of breath, cough, wheezing  Chronic left ankle pain .stated that he has been doing well since after having his ankle surgery was on oxycodone but ran out of the medication, as of coming appointment with the surgeon in February.  Currently has aching pain rated 4/10 not taking any medication.       Most recent fall risk assessment:    12/13/2021    2:35 PM  Fall Risk   Falls in the past year? 0  Number falls in past yr: 0  Injury with Fall? 0  Risk for fall due to : No Fall Risks  Follow up Falls evaluation completed     Most recent depression screenings:    03/06/2023    1:41 PM 02/22/2022    2:01 PM  PHQ 2/9 Scores  PHQ - 2 Score 0 0  PHQ- 9 Score 1         Patient Care Team: Donell Beers, FNP as PCP - General (Nurse Practitioner)   Outpatient Medications Prior to Visit  Medication Sig   cyclobenzaprine (FLEXERIL) 5 MG tablet Take 1 tablet (5 mg total) by mouth 3 (three) times daily as needed for muscle spasms. (Patient not taking: Reported on 03/06/2023)   doxycycline (VIBRA-TABS) 100 MG tablet Take 1 tablet (100 mg total) by mouth 2 (two) times daily. (Patient not taking: Reported on 03/06/2023)   mupirocin ointment (BACTROBAN) 2 % Apply 1 Application topically daily. (Patient not taking: Reported on 03/06/2023)   omeprazole (PRILOSEC) 20 MG capsule Take 1 capsule (20 mg total) by mouth daily. (Patient not taking: Reported on 03/06/2023)   oxyCODONE-acetaminophen  (PERCOCET/ROXICET) 5-325 MG tablet Take 1 tablet by mouth every 8 (eight) hours as needed. (Patient not taking: Reported on 03/06/2023)   [DISCONTINUED] ibuprofen (ADVIL) 600 MG tablet Take 1 tablet (600 mg total) by mouth every 8 (eight) hours as needed. (Patient not taking: Reported on 03/06/2023)   No facility-administered medications prior to visit.    Review of Systems  Constitutional:  Negative for appetite change, chills, fatigue and fever.  HENT:  Negative for congestion, postnasal drip, rhinorrhea and sneezing.   Eyes:  Negative for pain, discharge and itching.  Respiratory:  Negative for cough, shortness of breath and wheezing.   Cardiovascular:  Negative for chest pain, palpitations and leg swelling.  Gastrointestinal:  Negative for abdominal pain, constipation, nausea and vomiting.  Genitourinary:  Negative for difficulty urinating, dysuria, flank pain and frequency.  Musculoskeletal:  Positive for arthralgias. Negative for back pain, joint swelling and myalgias.  Skin:  Negative for color change, pallor, rash and wound.  Neurological:  Negative for dizziness, facial asymmetry, weakness, numbness and headaches.  Psychiatric/Behavioral:  Negative for behavioral problems, confusion, self-injury and suicidal ideas.        Objective:     BP (!) 104/56   Pulse 80   Temp (!) 97.3 F (36.3 C)   Wt 231 lb 9.6 oz (105.1 kg)   SpO2 100%  BMI 32.30 kg/m    Physical Exam Vitals and nursing note reviewed. Exam conducted with a chaperone present.  Constitutional:      General: He is not in acute distress.    Appearance: Normal appearance. He is obese. He is not ill-appearing, toxic-appearing or diaphoretic.  HENT:     Right Ear: Tympanic membrane, ear canal and external ear normal. There is no impacted cerumen.     Left Ear: Tympanic membrane, ear canal and external ear normal. There is no impacted cerumen.     Nose: Nose normal. No congestion or rhinorrhea.      Mouth/Throat:     Mouth: Mucous membranes are moist.     Pharynx: Oropharynx is clear. No oropharyngeal exudate or posterior oropharyngeal erythema.  Eyes:     General: No scleral icterus.       Right eye: No discharge.        Left eye: No discharge.     Extraocular Movements: Extraocular movements intact.     Conjunctiva/sclera: Conjunctivae normal.  Neck:     Vascular: No carotid bruit.  Cardiovascular:     Rate and Rhythm: Normal rate and regular rhythm.     Pulses: Normal pulses.     Heart sounds: Normal heart sounds. No murmur heard.    No friction rub. No gallop.  Pulmonary:     Effort: Pulmonary effort is normal. No respiratory distress.     Breath sounds: Normal breath sounds. No stridor. No wheezing, rhonchi or rales.  Chest:     Chest wall: No tenderness.  Abdominal:     General: Bowel sounds are normal. There is no distension.     Palpations: Abdomen is soft. There is no mass.     Tenderness: There is no abdominal tenderness. There is no right CVA tenderness, left CVA tenderness, guarding or rebound.     Hernia: No hernia is present.  Musculoskeletal:        General: No swelling, deformity or signs of injury.     Cervical back: Normal range of motion and neck supple. No rigidity or tenderness.     Right lower leg: No edema.     Left lower leg: No edema.     Comments: Surgical incision completely healed, has an ankle brace on.  Has palpable pedal pulses  Lymphadenopathy:     Cervical: No cervical adenopathy.  Skin:    General: Skin is warm and dry.     Capillary Refill: Capillary refill takes less than 2 seconds.     Coloration: Skin is not jaundiced or pale.     Findings: No bruising, erythema, lesion or rash.     Comments: Toenails appear discolored thickened and elongated  Neurological:     Mental Status: He is alert and oriented to person, place, and time.     Cranial Nerves: No cranial nerve deficit.     Sensory: No sensory deficit.     Motor: No weakness.      Coordination: Coordination normal.     Gait: Gait abnormal.     Deep Tendon Reflexes: Reflexes normal.  Psychiatric:        Mood and Affect: Mood normal.        Behavior: Behavior normal.        Thought Content: Thought content normal.        Judgment: Judgment normal.     No results found for any visits on 03/06/23.     Assessment & Plan:    Routine  Health Maintenance and Physical Exam   There is no immunization history on file for this patient.  Health Maintenance  Topic Date Due   HIV Screening  Never done   Hepatitis C Screening  Never done   DTaP/Tdap/Td (1 - Tdap) Never done   Colonoscopy  Never done   COVID-19 Vaccine (1 - 2024-25 season) Never done   Zoster Vaccines- Shingrix (1 of 2) Never done   INFLUENZA VACCINE  06/12/2023 (Originally 10/13/2022)   HPV VACCINES  Aged Out    Discussed health benefits of physical activity, and encouraged him to engage in regular exercise appropriate for his age and condition.  Problem List Items Addressed This Visit       Other   Chronic pain of left ankle   Status post ankle fusion surgery Ibuprofen 600 mg 3 times daily as needed refilled patient encouraged to take with food Follow-up with orthopedics as planned      Relevant Medications   ibuprofen (ADVIL) 600 MG tablet   Tobacco abuse   Smokes about 0.5  pack/day  Asked about quitting: confirms that he/she currently smokes cigarettes Advise to quit smoking: Educated about QUITTING to reduce the risk of cancer, cardio and cerebrovascular disease. Assess willingness: Unwilling to quit at this time, but is working on cutting back. Assist with counseling and pharmacotherapy: Counseled for 5 minutes and literature provided. Arrange for follow up: follow up in 12 months and continue to offer help.       Annual physical exam - Primary   Annual exam as documented.  Counseling done include healthy lifestyle involving committing to 150 minutes of exercise per week,  heart healthy diet, and attaining healthy weight. The importance of adequate sleep also discussed.  Regular use of seat belt and home safety were also discussed . Changes in health habits are decided on by patient with goals and time frames set for achieving them. Immunization and cancer screening  needs are specifically addressed at this visit.    Patient declined flu vaccine.  He was encouraged to consider getting shingles vaccine, Tdap vaccine and influenza vaccine 1. Screening for endocrine, nutritional, metabolic and immunity disorder  - PSA - Lipid panel; Future - CBC - CMP14+EGFR - Hepatitis C antibody - HIV Antibody (routine testing w rflx)       Discolored nails   Needs help with nail cut Patient referred to podiatry       Relevant Orders   Ambulatory referral to Podiatry   Abnormal gait   Due to chronic left ankle pain Ibuprofen 600 mg 3 times daily as needed refilled Encouraged to maintain close follow-up with orthopedics      Obesity (BMI 30-39.9)   Patient counseled on low-carb diet Encouraged to engage in regular moderate exercise as tolerated Wt Readings from Last 3 Encounters:  03/06/23 231 lb 9.6 oz (105.1 kg)  11/16/22 228 lb (103.4 kg)  09/28/22 226 lb 9.6 oz (102.8 kg)   Body mass index is 32.3 kg/m.       Other Visit Diagnoses       Screening for endocrine, nutritional, metabolic and immunity disorder       Relevant Orders   PSA   Lipid panel   CBC   CMP14+EGFR   Hepatitis C antibody   HIV Antibody (routine testing w rflx)     Screening for colon cancer       Relevant Orders   Cologuard      Return in about 1 year (  around 03/05/2024) for CPE, FASTING LABS THIS WEEK.     Donell Beers, FNP

## 2023-03-06 NOTE — Assessment & Plan Note (Signed)
Annual exam as documented.  Counseling done include healthy lifestyle involving committing to 150 minutes of exercise per week, heart healthy diet, and attaining healthy weight. The importance of adequate sleep also discussed.  Regular use of seat belt and home safety were also discussed . Changes in health habits are decided on by patient with goals and time frames set for achieving them. Immunization and cancer screening  needs are specifically addressed at this visit.    Patient declined flu vaccine.  He was encouraged to consider getting shingles vaccine, Tdap vaccine and influenza vaccine 1. Screening for endocrine, nutritional, metabolic and immunity disorder  - PSA - Lipid panel; Future - CBC - CMP14+EGFR - Hepatitis C antibody - HIV Antibody (routine testing w rflx)

## 2023-03-07 ENCOUNTER — Telehealth: Payer: Self-pay

## 2023-03-07 LAB — CMP14+EGFR
ALT: 17 [IU]/L (ref 0–44)
AST: 14 [IU]/L (ref 0–40)
Albumin: 4.5 g/dL (ref 4.1–5.1)
Alkaline Phosphatase: 97 [IU]/L (ref 44–121)
BUN/Creatinine Ratio: 10 (ref 9–20)
BUN: 13 mg/dL (ref 6–24)
Bilirubin Total: 0.3 mg/dL (ref 0.0–1.2)
CO2: 24 mmol/L (ref 20–29)
Calcium: 9.4 mg/dL (ref 8.7–10.2)
Chloride: 103 mmol/L (ref 96–106)
Creatinine, Ser: 1.26 mg/dL (ref 0.76–1.27)
Globulin, Total: 2.6 g/dL (ref 1.5–4.5)
Glucose: 77 mg/dL (ref 70–99)
Potassium: 4.3 mmol/L (ref 3.5–5.2)
Sodium: 141 mmol/L (ref 134–144)
Total Protein: 7.1 g/dL (ref 6.0–8.5)
eGFR: 69 mL/min/{1.73_m2} (ref >59)

## 2023-03-07 LAB — CBC
Hematocrit: 44.7 % (ref 37.5–51.0)
Hemoglobin: 15.2 g/dL (ref 13.0–17.7)
MCH: 30.7 pg (ref 26.6–33.0)
MCHC: 34 g/dL (ref 31.5–35.7)
MCV: 90 fL (ref 79–97)
Platelets: 250 10*3/uL (ref 150–450)
RBC: 4.95 x10E6/uL (ref 4.14–5.80)
RDW: 12.2 % (ref 11.6–15.4)
WBC: 7.5 10*3/uL (ref 3.4–10.8)

## 2023-03-07 LAB — PSA: Prostate Specific Ag, Serum: 1.1 ng/mL (ref 0.0–4.0)

## 2023-03-07 LAB — HIV ANTIBODY (ROUTINE TESTING W REFLEX): HIV Screen 4th Generation wRfx: NONREACTIVE

## 2023-03-07 LAB — HEPATITIS C ANTIBODY: Hep C Virus Ab: NONREACTIVE

## 2023-03-07 NOTE — Telephone Encounter (Signed)
Copied from CRM [id number]. Topic: Medical Record Request - Records Request >> Mar 07, 2023  9:11 AM Fuller Canada P wrote: Reason for CRM: Patient is requesting a paper copy of his labs to be sent to him.

## 2023-03-13 ENCOUNTER — Other Ambulatory Visit: Payer: Self-pay

## 2023-03-27 ENCOUNTER — Ambulatory Visit (INDEPENDENT_AMBULATORY_CARE_PROVIDER_SITE_OTHER): Payer: BLUE CROSS/BLUE SHIELD | Admitting: Podiatry

## 2023-03-27 ENCOUNTER — Encounter: Payer: Self-pay | Admitting: Podiatry

## 2023-03-27 DIAGNOSIS — M79675 Pain in left toe(s): Secondary | ICD-10-CM

## 2023-03-27 DIAGNOSIS — B351 Tinea unguium: Secondary | ICD-10-CM

## 2023-03-27 DIAGNOSIS — M79674 Pain in right toe(s): Secondary | ICD-10-CM | POA: Diagnosis not present

## 2023-03-27 NOTE — Progress Notes (Signed)
   Chief Complaint  Patient presents with   Nail Problem    Toenails - thick, discolored nails x years, referred by PCP for treatment, patient had ankle surgery 4 months ago on left   New Patient (Initial Visit)    SUBJECTIVE Patient presents to office today complaining of elongated, thickened nails that cause pain while ambulating in shoes.  Patient is unable to trim their own nails. Patient is here for further evaluation and treatment.  Past Medical History:  Diagnosis Date   GERD (gastroesophageal reflux disease)     No Known Allergies   OBJECTIVE General Patient is awake, alert, and oriented x 3 and in no acute distress. Derm Skin is dry and supple bilateral. Negative open lesions or macerations. Remaining integument unremarkable. Nails are tender, long, thickened and dystrophic with subungual debris, consistent with onychomycosis, 1-5 bilateral. No signs of infection noted. Vasc  DP and PT pedal pulses palpable bilaterally. Temperature gradient within normal limits.  Neuro Epicritic and protective threshold sensation grossly intact bilaterally.  Musculoskeletal Exam No symptomatic pedal deformities noted bilateral. Muscular strength within normal limits.  ASSESSMENT 1.  Pain due to onychomycosis of toenails both  PLAN OF CARE 1. Patient evaluated today.  2. Instructed to maintain good pedal hygiene and foot care.  3. Mechanical debridement of nails 1-5 bilaterally performed using a nail nipper. Filed with dremel without incident.  4. Return to clinic in 3 mos.    Thresa EMERSON Sar, DPM [facility]  Dr. Thresa EMERSON Sar, DPM    [address] [place], [place] [address]                Office [phone]  Fax [phone]

## 2023-04-11 ENCOUNTER — Ambulatory Visit: Payer: Self-pay | Admitting: Nurse Practitioner

## 2023-05-04 ENCOUNTER — Ambulatory Visit (INDEPENDENT_AMBULATORY_CARE_PROVIDER_SITE_OTHER): Payer: BLUE CROSS/BLUE SHIELD | Admitting: Orthopedic Surgery

## 2023-05-04 ENCOUNTER — Other Ambulatory Visit (INDEPENDENT_AMBULATORY_CARE_PROVIDER_SITE_OTHER): Payer: BLUE CROSS/BLUE SHIELD

## 2023-05-04 ENCOUNTER — Encounter: Payer: Self-pay | Admitting: Orthopedic Surgery

## 2023-05-04 DIAGNOSIS — Z981 Arthrodesis status: Secondary | ICD-10-CM | POA: Diagnosis not present

## 2023-05-04 NOTE — Progress Notes (Signed)
 Office Visit Note   Patient: Brandon Grant           Date of Birth: January 17, 1971           MRN: [id number] Visit Date: 05/04/2023              Requested by: Donell Beers, FNP [phone] [address] [place],  [place] [address] PCP: Donell Beers, FNP  Chief Complaint  Patient presents with   Left Ankle - Follow-up      HPI: Patient is a 53 year old gentleman who is seen 5 months out from a left ankle fusion.  Patient states he does have start up stiffness especially at night.  Has difficulty going downstairs with sequential gait.  Assessment & Plan: Visit Diagnoses:  1. S/P ankle fusion     Plan: Patient is provided a new ASO.  His current ASO is torn and nonfunctional.  At this point patient's full-time disability will be at least a year.  Would be able to reevaluate patient after this time to see what type of function was available with his ankle for work.  Follow-Up Instructions: Return if symptoms worsen or fail to improve.   Ortho Exam  Patient is alert, oriented, no adenopathy, well-dressed, normal affect, normal respiratory effort. Examination the foot is plantar-grade the incision is well-healed there is no swelling.  The fusion is stable.  Imaging: XR Ankle 2 Views Left Result Date: 05/04/2023 2 view radiographs of the left ankle shows stable ankle fusion without hardware loosening or failure.  No images are attached to the encounter.  Labs: No results found for: "HGBA1C", "ESRSEDRATE", "CRP", "LABURIC", "REPTSTATUS", "GRAMSTAIN", "CULT", "LABORGA"   Lab Results  Component Value Date   ALBUMIN 4.5 03/06/2023   ALBUMIN 4.4 10/13/2021    No results found for: "MG" No results found for: "VD25OH"  No results found for: "PREALBUMIN"    Latest Ref Rng & Units 03/06/2023    2:56 PM 11/16/2022    7:05 AM 12/13/2021    3:04 PM  CBC EXTENDED  WBC 3.4 - 10.8 x10E3/uL 7.5  7.1  7.5   RBC 4.14 - 5.80 x10E6/uL 4.95  4.95  4.74   Hemoglobin 13.0 -  17.7 g/dL 02.7  25.3  66.4   HCT 37.5 - 51.0 % 44.7  43.6  42.2   Platelets 150 - 450 x10E3/uL 250  197  218      There is no height or weight on file to calculate BMI.  Orders:  Orders Placed This Encounter  Procedures   XR Ankle 2 Views Left   No orders of the defined types were placed in this encounter.    Procedures: No procedures performed  Clinical Data: No additional findings.  ROS:  All other systems negative, except as noted in the HPI. Review of Systems  Objective: Vital Signs: There were no vitals taken for this visit.  Specialty Comments:  No specialty comments available.  PMFS History: Patient Active Problem List   Diagnosis Date Noted   Annual physical exam 03/06/2023   Discolored nails 03/06/2023   Abnormal gait 03/06/2023   Obesity (BMI 30-39.9) 03/06/2023   Traumatic arthritis of left ankle 11/16/2022   S/P ankle fusion 11/16/2022   Influenza A 02/22/2022   Tobacco abuse 02/22/2022   Tobacco abuse counseling 01/21/2022   Chronic pain of left ankle 10/14/2021   Past Medical History:  Diagnosis Date   GERD (gastroesophageal reflux disease)     Family History  Problem Relation Age of Onset   Cancer Mother    Cancer Father     Past Surgical History:  Procedure Laterality Date   ANKLE FUSION Left 11/16/2022   Procedure: FUSION LEFT ANKLE;  Surgeon: Nadara Mustard, MD;  Location: [facility] OR;  Service: Orthopedics;  Laterality: Left;   FRACTURE SURGERY Left 2014   ankle   Social History   Occupational History   Not on file  Tobacco Use   Smoking status: Every Day    Current packs/day: 0.60    Types: Cigarettes   Smokeless tobacco: Not on file  Vaping Use   Vaping status: Never Used  Substance and Sexual Activity   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

## 2023-06-26 ENCOUNTER — Ambulatory Visit: Payer: BLUE CROSS/BLUE SHIELD | Admitting: Podiatry

## 2024-02-16 ENCOUNTER — Telehealth: Payer: Self-pay

## 2024-02-16 NOTE — Telephone Encounter (Signed)
 Called patient to reschedule his physical due to provider not in the office that afternoon and too soon for physical. Physicals are to be scheduled one year and a day in order for the insurance company to fully cover the visit regarding a physical.

## 2024-03-05 ENCOUNTER — Ambulatory Visit: Payer: Self-pay | Admitting: Nurse Practitioner

## 2024-03-05 ENCOUNTER — Ambulatory Visit: Admitting: Nurse Practitioner
